# Patient Record
Sex: Female | Born: 1960 | Race: White | Hispanic: Yes | Marital: Married | Smoking: Never smoker
Health system: Southern US, Community
[De-identification: ages and names within clinical notes are randomized; demographics above are authoritative.]

## PROBLEM LIST (undated history)

## (undated) DIAGNOSIS — E669 Obesity, unspecified: Secondary | ICD-10-CM

## (undated) DIAGNOSIS — M1990 Unspecified osteoarthritis, unspecified site: Secondary | ICD-10-CM

## (undated) DIAGNOSIS — Z780 Asymptomatic menopausal state: Secondary | ICD-10-CM

## (undated) DIAGNOSIS — T7840XA Allergy, unspecified, initial encounter: Secondary | ICD-10-CM

## (undated) DIAGNOSIS — E785 Hyperlipidemia, unspecified: Secondary | ICD-10-CM

## (undated) HISTORY — PX: ABDOMINAL HYSTERECTOMY: SHX81

## (undated) HISTORY — PX: OTHER SURGICAL HISTORY: SHX169

## (undated) HISTORY — DX: Obesity, unspecified: E66.9

## (undated) HISTORY — DX: Asymptomatic menopausal state: Z78.0

## (undated) HISTORY — DX: Unspecified osteoarthritis, unspecified site: M19.90

## (undated) HISTORY — DX: Hyperlipidemia, unspecified: E78.5

## (undated) HISTORY — PX: KNEE ARTHROSCOPY: SUR90

## (undated) HISTORY — DX: Allergy, unspecified, initial encounter: T78.40XA

---

## 1997-12-06 ENCOUNTER — Ambulatory Visit (HOSPITAL_BASED_OUTPATIENT_CLINIC_OR_DEPARTMENT_OTHER): Admission: RE | Admit: 1997-12-06 | Discharge: 1997-12-06 | Payer: Self-pay | Admitting: Urology

## 1998-01-10 ENCOUNTER — Other Ambulatory Visit: Admission: RE | Admit: 1998-01-10 | Discharge: 1998-01-10 | Payer: Self-pay | Admitting: *Deleted

## 1998-04-16 ENCOUNTER — Ambulatory Visit (HOSPITAL_BASED_OUTPATIENT_CLINIC_OR_DEPARTMENT_OTHER): Admission: RE | Admit: 1998-04-16 | Discharge: 1998-04-16 | Payer: Self-pay | Admitting: Urology

## 1998-08-31 ENCOUNTER — Other Ambulatory Visit: Admission: RE | Admit: 1998-08-31 | Discharge: 1998-08-31 | Payer: Self-pay | Admitting: *Deleted

## 1999-02-28 ENCOUNTER — Other Ambulatory Visit: Admission: RE | Admit: 1999-02-28 | Discharge: 1999-02-28 | Payer: Self-pay | Admitting: *Deleted

## 1999-10-03 ENCOUNTER — Other Ambulatory Visit: Admission: RE | Admit: 1999-10-03 | Discharge: 1999-10-03 | Payer: Self-pay | Admitting: *Deleted

## 2000-04-21 ENCOUNTER — Encounter: Admission: RE | Admit: 2000-04-21 | Discharge: 2000-05-27 | Payer: Self-pay | Admitting: Internal Medicine

## 2000-04-30 ENCOUNTER — Other Ambulatory Visit: Admission: RE | Admit: 2000-04-30 | Discharge: 2000-04-30 | Payer: Self-pay | Admitting: *Deleted

## 2001-10-01 ENCOUNTER — Other Ambulatory Visit: Admission: RE | Admit: 2001-10-01 | Discharge: 2001-10-01 | Payer: Self-pay | Admitting: *Deleted

## 2006-01-17 HISTORY — PX: INCONTINENCE SURGERY: SHX676

## 2009-03-20 HISTORY — PX: OTHER SURGICAL HISTORY: SHX169

## 2010-07-03 ENCOUNTER — Ambulatory Visit (INDEPENDENT_AMBULATORY_CARE_PROVIDER_SITE_OTHER): Payer: BC Managed Care – PPO | Admitting: Internal Medicine

## 2010-07-03 DIAGNOSIS — M159 Polyosteoarthritis, unspecified: Secondary | ICD-10-CM

## 2010-07-03 DIAGNOSIS — E785 Hyperlipidemia, unspecified: Secondary | ICD-10-CM

## 2010-07-03 DIAGNOSIS — N951 Menopausal and female climacteric states: Secondary | ICD-10-CM

## 2010-07-11 ENCOUNTER — Ambulatory Visit: Payer: BC Managed Care – PPO | Admitting: Internal Medicine

## 2010-07-29 ENCOUNTER — Ambulatory Visit (INDEPENDENT_AMBULATORY_CARE_PROVIDER_SITE_OTHER): Payer: BC Managed Care – PPO | Admitting: Internal Medicine

## 2010-07-29 ENCOUNTER — Other Ambulatory Visit: Payer: Self-pay

## 2010-07-29 DIAGNOSIS — E669 Obesity, unspecified: Secondary | ICD-10-CM

## 2010-07-29 DIAGNOSIS — E785 Hyperlipidemia, unspecified: Secondary | ICD-10-CM

## 2010-07-29 DIAGNOSIS — Z1272 Encounter for screening for malignant neoplasm of vagina: Secondary | ICD-10-CM

## 2010-07-29 DIAGNOSIS — R7989 Other specified abnormal findings of blood chemistry: Secondary | ICD-10-CM

## 2010-07-29 DIAGNOSIS — Z113 Encounter for screening for infections with a predominantly sexual mode of transmission: Secondary | ICD-10-CM

## 2010-07-29 DIAGNOSIS — Z01419 Encounter for gynecological examination (general) (routine) without abnormal findings: Secondary | ICD-10-CM

## 2010-08-01 ENCOUNTER — Other Ambulatory Visit: Payer: Self-pay | Admitting: Internal Medicine

## 2010-08-05 ENCOUNTER — Ambulatory Visit: Payer: BC Managed Care – PPO | Attending: Internal Medicine | Admitting: Physical Therapy

## 2010-08-05 DIAGNOSIS — M62838 Other muscle spasm: Secondary | ICD-10-CM | POA: Insufficient documentation

## 2010-08-05 DIAGNOSIS — IMO0001 Reserved for inherently not codable concepts without codable children: Secondary | ICD-10-CM | POA: Insufficient documentation

## 2010-08-05 DIAGNOSIS — M2420 Disorder of ligament, unspecified site: Secondary | ICD-10-CM | POA: Insufficient documentation

## 2010-08-05 DIAGNOSIS — M629 Disorder of muscle, unspecified: Secondary | ICD-10-CM | POA: Insufficient documentation

## 2010-08-06 ENCOUNTER — Ambulatory Visit (HOSPITAL_BASED_OUTPATIENT_CLINIC_OR_DEPARTMENT_OTHER)
Admission: RE | Admit: 2010-08-06 | Discharge: 2010-08-06 | Disposition: A | Discharge status: Home / Self Care | Payer: BC Managed Care – PPO | Source: Ambulatory Visit | Attending: Internal Medicine | Admitting: Internal Medicine

## 2010-08-06 DIAGNOSIS — R7989 Other specified abnormal findings of blood chemistry: Secondary | ICD-10-CM

## 2010-08-06 DIAGNOSIS — R945 Abnormal results of liver function studies: Secondary | ICD-10-CM | POA: Insufficient documentation

## 2010-08-07 ENCOUNTER — Encounter: Payer: Self-pay | Admitting: Internal Medicine

## 2010-08-07 ENCOUNTER — Ambulatory Visit (AMBULATORY_SURGERY_CENTER): Payer: BC Managed Care – PPO | Admitting: *Deleted

## 2010-08-07 VITALS — Ht 62.0 in | Wt 194.2 lb

## 2010-08-07 DIAGNOSIS — Z1211 Encounter for screening for malignant neoplasm of colon: Secondary | ICD-10-CM

## 2010-08-07 MED ORDER — PEG-KCL-NACL-NASULF-NA ASC-C 100 G PO SOLR: ORAL | Status: DC

## 2010-08-12 ENCOUNTER — Ambulatory Visit: Payer: BC Managed Care – PPO | Admitting: Physical Therapy

## 2010-08-12 ENCOUNTER — Telehealth: Payer: Self-pay | Admitting: Gastroenterology

## 2010-08-12 MED ORDER — PEG-KCL-NACL-NASULF-NA ASC-C 100 G PO SOLR: ORAL | Status: DC

## 2010-08-12 NOTE — Telephone Encounter (Signed)
SPOKE WITH PATIENT, NOTIFIED HER THAT MOVIPREP WAS RESENT TO HER PHARMACY. ALSO I CALLED [facility] LEFT MESSAGE FOR PATIENTS MOVIPREP RX. Sherren Kerns

## 2010-08-19 ENCOUNTER — Ambulatory Visit: Payer: BC Managed Care – PPO | Attending: Internal Medicine | Admitting: Physical Therapy

## 2010-08-19 ENCOUNTER — Ambulatory Visit: Payer: BC Managed Care – PPO | Admitting: *Deleted

## 2010-08-19 DIAGNOSIS — IMO0001 Reserved for inherently not codable concepts without codable children: Secondary | ICD-10-CM | POA: Insufficient documentation

## 2010-08-19 DIAGNOSIS — M2420 Disorder of ligament, unspecified site: Secondary | ICD-10-CM | POA: Insufficient documentation

## 2010-08-19 DIAGNOSIS — M629 Disorder of muscle, unspecified: Secondary | ICD-10-CM | POA: Insufficient documentation

## 2010-08-19 DIAGNOSIS — M62838 Other muscle spasm: Secondary | ICD-10-CM | POA: Insufficient documentation

## 2010-08-28 ENCOUNTER — Other Ambulatory Visit: Payer: BC Managed Care – PPO | Admitting: Gastroenterology

## 2010-08-30 ENCOUNTER — Ambulatory Visit: Payer: BC Managed Care – PPO | Admitting: Physical Therapy

## 2010-09-04 ENCOUNTER — Encounter: Payer: Self-pay | Admitting: *Deleted

## 2010-09-04 ENCOUNTER — Encounter: Payer: BC Managed Care – PPO | Attending: Internal Medicine | Admitting: *Deleted

## 2010-09-04 ENCOUNTER — Ambulatory Visit: Payer: BC Managed Care – PPO | Admitting: *Deleted

## 2010-09-04 DIAGNOSIS — E669 Obesity, unspecified: Secondary | ICD-10-CM | POA: Insufficient documentation

## 2010-09-04 DIAGNOSIS — Z713 Dietary counseling and surveillance: Secondary | ICD-10-CM | POA: Insufficient documentation

## 2010-09-04 NOTE — Patient Instructions (Addendum)
Goals:  Eat 3 meals/day -- Avoid meal skipping.   Increase protein rich foods.  Follow "Plate Method" for portion control.  Limit carbohydrate 45 grams per meal and 15 grams for snack.   Choose more whole grains, lean protein, low-fat dairy, and fruits/non-starchy vegetables.   Aim for >30 min of physical activity daily as able.  Limit sugar-sweetened beverages and concentrated sweets.

## 2010-09-04 NOTE — Progress Notes (Signed)
  Medical Nutrition Therapy:  Appt start time:  4:30p  end time: 5:30p.   Assessment:  Primary concerns today: Obesity/Weight Management.  Pt 9 mos s/p knee surgery here for weight management.  Unable to exercise for long periods d/t pain/swelling in knee.  Overall dietary intake WNL, but excessive CHO portions noted.  Pt states she gained 20 lbs in the last ~4-5 years and wants to lose 40-50 lbs.  MEDICATIONS: Glucosamine-Chondroitin, Remfemin (Black Cohosh), advil/motrin (prn)  DIETARY INTAKE:  Usual eating pattern includes 3 meals and 1-2 snacks per day.  24-hr recall:  B ( AM): Honey nut cheerios, blueberries, skim milk  Snk ( AM): none  L ( PM): Sandwich, animal crackers, chobani yogurt (flavored), crystal light Snk ( PM): cheese stick, special K bar D ( PM): Spaghetti, french bread (2 pcs), salad w/ LF balsalmic dressing Snk ( PM): ice cream, special K bar (in summer only)  Usual physical activity: Stationary bike 2-3x/week for 7-8 min -- reports knee surgery 11/2009 and current sx of fluid/pain in knee prohibiting most exercise.  Estimated energy needs: 1300 calories 160-165 g carbohydrates 80 g protein 35-38 g fat 25-30 g fiber  Progress Towards Goal(s):  NEW.   Nutritional Diagnosis:  [place]-3.3 Obesity related to excessive CHO intake and lack of exercise post-surgery as evidenced by food recall and patient report of knee pain upon physical exertion.    Intervention/Goals:  Eat 3 meals/day -- Avoid meal skipping.   Increase protein rich foods.  Follow "Plate Method" for portion control.  Limit carbohydrate 45 grams per meal and 15 grams for snack.   Choose more whole grains, lean protein, low-fat dairy, and fruits/non-starchy vegetables.   Aim for >30 min of physical activity daily as able.  Limit sugar-sweetened beverages and concentrated sweets.  Monitoring/Evaluation:  Dietary intake, exercise, and body weight in 6 week(s).

## 2010-09-05 ENCOUNTER — Encounter: Payer: Self-pay | Admitting: *Deleted

## 2010-09-10 ENCOUNTER — Encounter: Payer: Self-pay | Admitting: Gastroenterology

## 2010-09-10 ENCOUNTER — Ambulatory Visit (AMBULATORY_SURGERY_CENTER): Payer: BC Managed Care – PPO | Admitting: Gastroenterology

## 2010-09-10 VITALS — BP 112/64 | HR 70 | Temp 98.4°F | Resp 16 | Ht 63.0 in | Wt 195.0 lb

## 2010-09-10 DIAGNOSIS — Z1211 Encounter for screening for malignant neoplasm of colon: Secondary | ICD-10-CM

## 2010-09-10 MED ORDER — SODIUM CHLORIDE 0.9 % IV SOLN: 500.0000 mL | INTRAVENOUS | Status: DC

## 2010-09-10 NOTE — Patient Instructions (Signed)
Please review discharge instructions.

## 2010-09-11 ENCOUNTER — Telehealth: Payer: Self-pay | Admitting: *Deleted

## 2010-09-11 ENCOUNTER — Telehealth: Payer: Self-pay | Admitting: Internal Medicine

## 2010-09-11 DIAGNOSIS — L4050 Arthropathic psoriasis, unspecified: Secondary | ICD-10-CM

## 2010-09-11 NOTE — Telephone Encounter (Signed)
Pt saw Dr Emily Filbert for Psoriasis and they want her to be referred to a Rheumatologists Dr Dierdre Forth.  The referral is for Psoriasis Arthritis. Dr Shawnee Knapp phone number [phone].  Pt phone number to call back [phone].

## 2010-09-11 NOTE — Telephone Encounter (Signed)
Ok to schedule referral to Dr Dierdre Forth?  See consult note on pt in your bin on your desk for details from visit

## 2010-09-11 NOTE — Telephone Encounter (Signed)

## 2010-09-12 NOTE — Telephone Encounter (Signed)
Spoke with Pam at [facility], they did the referral to Dr Shawnee Knapp [facility] on 08/29/10, but have not heard anything in response.  Spoke with pt.  She states the problem is [employer] (her secondary insurance) requires a referral from her primary care to a specialist in order for [employer] to pay.  Okay per DDS to do referral to Dr Dierdre Forth in rheumatology.

## 2010-09-12 NOTE — Telephone Encounter (Signed)
Selena Batten- can you please complete referral to Dr Dierdre Forth at [facility] for psoriatic arthritis?  Thanks!

## 2010-09-12 NOTE — Telephone Encounter (Signed)
OK to call Dr. Dellia Nims office to see if they set up this appt. already

## 2010-09-12 NOTE — Telephone Encounter (Signed)
Appt scheduled with Dr Dierdre Forth on August 1,2012 at 10:00 am. Pt notified of appt by phone.

## 2010-09-18 ENCOUNTER — Ambulatory Visit: Payer: BC Managed Care – PPO | Attending: Internal Medicine | Admitting: Physical Therapy

## 2010-09-18 DIAGNOSIS — IMO0001 Reserved for inherently not codable concepts without codable children: Secondary | ICD-10-CM | POA: Insufficient documentation

## 2010-09-18 DIAGNOSIS — M62838 Other muscle spasm: Secondary | ICD-10-CM | POA: Insufficient documentation

## 2010-09-18 DIAGNOSIS — M629 Disorder of muscle, unspecified: Secondary | ICD-10-CM | POA: Insufficient documentation

## 2010-09-18 DIAGNOSIS — M2420 Disorder of ligament, unspecified site: Secondary | ICD-10-CM | POA: Insufficient documentation

## 2010-10-02 NOTE — Progress Notes (Signed)
Addended by: Virgel Paling on: 10/02/2010 03:44 PM   Modules accepted: Orders

## 2010-10-16 ENCOUNTER — Ambulatory Visit: Payer: BC Managed Care – PPO | Admitting: Physical Therapy

## 2010-10-16 ENCOUNTER — Ambulatory Visit: Payer: BC Managed Care – PPO | Admitting: *Deleted

## 2010-10-28 ENCOUNTER — Encounter: Payer: BC Managed Care – PPO | Admitting: Physical Therapy

## 2010-10-30 ENCOUNTER — Ambulatory Visit: Payer: BC Managed Care – PPO | Attending: Internal Medicine | Admitting: Physical Therapy

## 2010-10-30 DIAGNOSIS — IMO0001 Reserved for inherently not codable concepts without codable children: Secondary | ICD-10-CM | POA: Insufficient documentation

## 2010-10-30 DIAGNOSIS — M2420 Disorder of ligament, unspecified site: Secondary | ICD-10-CM | POA: Insufficient documentation

## 2010-10-30 DIAGNOSIS — M62838 Other muscle spasm: Secondary | ICD-10-CM | POA: Insufficient documentation

## 2010-10-30 DIAGNOSIS — M629 Disorder of muscle, unspecified: Secondary | ICD-10-CM | POA: Insufficient documentation

## 2010-11-04 ENCOUNTER — Ambulatory Visit: Payer: BC Managed Care – PPO | Admitting: Physical Therapy

## 2010-11-06 ENCOUNTER — Ambulatory Visit: Payer: BC Managed Care – PPO | Admitting: Physical Therapy

## 2010-11-11 ENCOUNTER — Ambulatory Visit: Payer: BC Managed Care – PPO | Admitting: Physical Therapy

## 2010-11-13 ENCOUNTER — Ambulatory Visit: Payer: BC Managed Care – PPO | Admitting: Physical Therapy

## 2010-11-18 ENCOUNTER — Ambulatory Visit: Payer: BC Managed Care – PPO | Attending: Internal Medicine | Admitting: Physical Therapy

## 2010-11-18 DIAGNOSIS — M2420 Disorder of ligament, unspecified site: Secondary | ICD-10-CM | POA: Insufficient documentation

## 2010-11-18 DIAGNOSIS — IMO0001 Reserved for inherently not codable concepts without codable children: Secondary | ICD-10-CM | POA: Insufficient documentation

## 2010-11-18 DIAGNOSIS — M62838 Other muscle spasm: Secondary | ICD-10-CM | POA: Insufficient documentation

## 2010-11-18 DIAGNOSIS — M629 Disorder of muscle, unspecified: Secondary | ICD-10-CM | POA: Insufficient documentation

## 2010-11-20 ENCOUNTER — Ambulatory Visit: Payer: BC Managed Care – PPO | Admitting: Physical Therapy

## 2010-11-25 ENCOUNTER — Ambulatory Visit: Payer: BC Managed Care – PPO | Admitting: Physical Therapy

## 2010-11-27 ENCOUNTER — Ambulatory Visit: Payer: BC Managed Care – PPO | Admitting: Physical Therapy

## 2010-12-02 ENCOUNTER — Encounter: Payer: BC Managed Care – PPO | Admitting: Physical Therapy

## 2010-12-04 ENCOUNTER — Encounter: Payer: BC Managed Care – PPO | Admitting: Physical Therapy

## 2010-12-09 ENCOUNTER — Encounter: Payer: BC Managed Care – PPO | Admitting: Physical Therapy

## 2010-12-11 ENCOUNTER — Encounter: Payer: BC Managed Care – PPO | Admitting: Physical Therapy

## 2010-12-16 ENCOUNTER — Encounter: Payer: BC Managed Care – PPO | Admitting: Physical Therapy

## 2010-12-18 ENCOUNTER — Encounter: Payer: BC Managed Care – PPO | Admitting: Physical Therapy

## 2010-12-23 ENCOUNTER — Encounter: Payer: BC Managed Care – PPO | Admitting: Physical Therapy

## 2010-12-25 ENCOUNTER — Encounter: Payer: BC Managed Care – PPO | Admitting: Physical Therapy

## 2012-02-18 HISTORY — PX: KNEE CARTILAGE SURGERY: SHX688

## 2012-04-14 ENCOUNTER — Ambulatory Visit (INDEPENDENT_AMBULATORY_CARE_PROVIDER_SITE_OTHER): Payer: BC Managed Care – PPO | Admitting: Internal Medicine

## 2012-04-14 ENCOUNTER — Ambulatory Visit (HOSPITAL_BASED_OUTPATIENT_CLINIC_OR_DEPARTMENT_OTHER)
Admission: RE | Admit: 2012-04-14 | Discharge: 2012-04-14 | Disposition: A | Discharge status: Home / Self Care | Payer: BC Managed Care – PPO | Source: Ambulatory Visit | Attending: Internal Medicine | Admitting: Internal Medicine

## 2012-04-14 ENCOUNTER — Encounter: Payer: Self-pay | Admitting: Internal Medicine

## 2012-04-14 ENCOUNTER — Ambulatory Visit (HOSPITAL_BASED_OUTPATIENT_CLINIC_OR_DEPARTMENT_OTHER): Admission: RE | Admit: 2012-04-14 | Payer: BC Managed Care – PPO | Source: Ambulatory Visit

## 2012-04-14 VITALS — BP 136/86 | HR 80 | Temp 98.6°F | Resp 16 | Ht 63.0 in | Wt 198.0 lb

## 2012-04-14 DIAGNOSIS — L409 Psoriasis, unspecified: Secondary | ICD-10-CM | POA: Insufficient documentation

## 2012-04-14 DIAGNOSIS — L408 Other psoriasis: Secondary | ICD-10-CM

## 2012-04-14 DIAGNOSIS — R922 Inconclusive mammogram: Secondary | ICD-10-CM | POA: Insufficient documentation

## 2012-04-14 DIAGNOSIS — Z90711 Acquired absence of uterus with remaining cervical stump: Secondary | ICD-10-CM

## 2012-04-14 DIAGNOSIS — Z0000 Encounter for general adult medical examination without abnormal findings: Secondary | ICD-10-CM

## 2012-04-14 DIAGNOSIS — L821 Other seborrheic keratosis: Secondary | ICD-10-CM

## 2012-04-14 DIAGNOSIS — E669 Obesity, unspecified: Secondary | ICD-10-CM

## 2012-04-14 LAB — POCT URINALYSIS DIPSTICK
Bilirubin, UA: NEGATIVE
Ketones, UA: NEGATIVE
Leukocytes, UA: NEGATIVE
Spec Grav, UA: 1.015
pH, UA: 5

## 2012-04-14 NOTE — Patient Instructions (Addendum)
Activate my chart    To have mammogram today

## 2012-04-14 NOTE — Progress Notes (Signed)
Subjective:    Patient ID: Brittany Roberson, female    DOB: June 01, 1960, 52 y.o.   MRN: [id number]  HPI Mariama is here for CPE  Had recent R knee surgery per Dr. Charlann Boxer  Other joints OK  Psoriasis  Has Clobetasol when needed  Mild hyperlipidemia  Watching diet  She is frustrated by weight gain and would like to see someone about this    Allergies  Allergen Reactions  . Shellfish Allergy Itching    flushing   Past Medical History  Diagnosis Date  . Allergy   . Arthritis     knees  . Menopause   . Hyperlipidemia   . Endometriosis   . Obesity    Past Surgical History  Procedure Laterality Date  . Abdominal hysterectomy      2007  . Carpal tunnel repair  2/11    Right  . Menicus repair      rt.knee-11/2009  . Incontinence surgery  12/07  . Knee arthroscopy     History   Social History  . Marital Status: Married    Spouse Name: Rocky Link    Number of Children: 0  . Years of Education: Master's   Occupational History  . SPEECH PATHOLOGIST    Social History Main Topics  . Smoking status: Never Smoker   . Smokeless tobacco: Never Used  . Alcohol Use: No  . Drug Use: No  . Sexually Active: Yes -- Female partner(s)   Other Topics Concern  . Not on file   Social History Narrative  . No narrative on file   Family History  Problem Relation Age of Onset  . Hypertension Father   . Hyperlipidemia Father    Patient Active Problem List  Diagnosis  . DJD (degenerative joint disease) of knee  . Other and unspecified hyperlipidemia  . S/P hysterectomy  . History of hysterectomy, supracervical  . Psoriasis   Current Outpatient Prescriptions on File Prior to Visit  Medication Sig Dispense Refill  . Black Cohosh (REMIFEMIN PO) Take by mouth 2 (two) times daily.        Marland Kitchen glucosamine-chondroitin 500-400 MG tablet Take 1 tablet by mouth 3 (three) times daily.       Marland Kitchen ibuprofen (ADVIL,MOTRIN) 200 MG tablet Take 400 mg by mouth every 6 (six) hours as needed.          No current facility-administered medications on file prior to visit.       Review of Systems     Objective:   Physical Exam Physical Exam  Nursing note and vitals reviewed.  Constitutional: She is oriented to person, place, and time. She appears well-developed and well-nourished.  HENT:  Head: Normocephalic and atraumatic.  Right Ear: Tympanic membrane and ear canal normal. No drainage. Tympanic membrane is not injected and not erythematous.  Left Ear: Tympanic membrane and ear canal normal. No drainage. Tympanic membrane is not injected and not erythematous.  Nose: Nose normal. Right sinus exhibits no maxillary sinus tenderness and no frontal sinus tenderness. Left sinus exhibits no maxillary sinus tenderness and no frontal sinus tenderness.  Mouth/Throat: Oropharynx is clear and moist. No oral lesions. No oropharyngeal exudate.  Eyes: Conjunctivae and EOM are normal. Pupils are equal, round, and reactive to light.  Neck: Normal range of motion. Neck supple. No JVD present. Carotid bruit is not present. No mass and no thyromegaly present.  Cardiovascular: Normal rate, regular rhythm, S1 normal, S2 normal and intact distal pulses. Exam reveals no gallop and no  friction rub.  No murmur heard.  Pulses:  Carotid pulses are 2+ on the right side, and 2+ on the left side.  Dorsalis pedis pulses are 2+ on the right side, and 2+ on the left side.  No carotid bruit. No LE edema  Pulmonary/Chest: Breath sounds normal. She has no wheezes. She has no rales. She exhibits no tenderness. Breasts no discrete masses no nipple discharge no axillary adenopathy bilaterally Abdominal: Soft. Bowel sounds are normal. She exhibits no distension and no mass. There is no hepatosplenomegaly. There is no tenderness. There is no CVA tenderness.  Rectal no mass guaiac neg Musculoskeletal: Normal range of motion.  No active synovitis to joints.  Lymphadenopathy:  She has no cervical adenopathy.  She has no  axillary adenopathy.  Right: No inguinal and no supraclavicular adenopathy present.  Left: No inguinal and no supraclavicular adenopathy present.  Neurological: She is alert and oriented to person, place, and time. She has normal strength and normal reflexes. She displays no tremor. No cranial nerve deficit or sensory deficit. Coordination and gait normal.  Skin: Skin is warm and dry. No rash noted. No cyanosis. Nails show no clubbing.  Psychiatric: She has a normal mood and affect. Her speech is normal and behavior is normal. Cognition and memory are normal.           Assessment & Plan:  Health Maintenance MM today  Pap for supracervical hys  Done 2012   Labs today  See scanned sheet  Hyperlipidemia  Check today  Obesity will refer to Dr. Kinnie Scales  History of mild elevation lfts    Ultrasound neg for mass lelsion  Djd  Psoriasis

## 2012-04-16 ENCOUNTER — Other Ambulatory Visit: Payer: Self-pay | Admitting: Internal Medicine

## 2012-04-16 LAB — LIPID PANEL
Cholesterol: 192 mg/dL (ref 0–200)
HDL: 40 mg/dL (ref >39)
Total CHOL/HDL Ratio: 4.8 Ratio
VLDL: 29 mg/dL (ref 0–40)

## 2012-04-16 LAB — CBC WITH DIFFERENTIAL/PLATELET
Basophils Relative: 0 % (ref 0–1)
Eosinophils Absolute: 0.1 10*3/uL (ref 0.0–0.7)
Eosinophils Relative: 1 % (ref 0–5)
HCT: 42.9 % (ref 36.0–46.0)
Hemoglobin: 15.9 g/dL — ABNORMAL HIGH (ref 12.0–15.0)
MCH: 32.4 pg (ref 26.0–34.0)
MCHC: 37.1 g/dL — ABNORMAL HIGH (ref 30.0–36.0)
Monocytes Absolute: 0.5 10*3/uL (ref 0.1–1.0)
Monocytes Relative: 9 % (ref 3–12)
RDW: 14 % (ref 11.5–15.5)

## 2012-04-19 ENCOUNTER — Encounter: Payer: Self-pay | Admitting: *Deleted

## 2012-04-21 ENCOUNTER — Encounter: Payer: Self-pay | Admitting: *Deleted

## 2012-04-28 ENCOUNTER — Encounter: Payer: Self-pay | Admitting: *Deleted

## 2012-04-30 ENCOUNTER — Ambulatory Visit
Admission: RE | Admit: 2012-04-30 | Discharge: 2012-04-30 | Disposition: A | Discharge status: Home / Self Care | Payer: BC Managed Care – PPO | Source: Ambulatory Visit | Attending: Internal Medicine | Admitting: Internal Medicine

## 2012-05-10 ENCOUNTER — Encounter: Payer: Self-pay | Admitting: *Deleted

## 2012-12-23 ENCOUNTER — Other Ambulatory Visit: Payer: Self-pay

## 2013-05-16 ENCOUNTER — Ambulatory Visit: Payer: Self-pay | Admitting: Physician Assistant

## 2013-05-30 ENCOUNTER — Ambulatory Visit (HOSPITAL_BASED_OUTPATIENT_CLINIC_OR_DEPARTMENT_OTHER)
Admission: RE | Admit: 2013-05-30 | Discharge: 2013-05-30 | Disposition: A | Discharge status: Home / Self Care | Payer: BC Managed Care – PPO | Source: Ambulatory Visit | Attending: Internal Medicine | Admitting: Internal Medicine

## 2013-05-30 ENCOUNTER — Encounter: Payer: Self-pay | Admitting: Internal Medicine

## 2013-05-30 ENCOUNTER — Ambulatory Visit (INDEPENDENT_AMBULATORY_CARE_PROVIDER_SITE_OTHER): Payer: BC Managed Care – PPO | Admitting: Internal Medicine

## 2013-05-30 VITALS — BP 138/81 | HR 73 | Temp 97.9°F | Resp 18 | Wt 198.0 lb

## 2013-05-30 DIAGNOSIS — M545 Low back pain, unspecified: Secondary | ICD-10-CM | POA: Insufficient documentation

## 2013-05-30 DIAGNOSIS — R0602 Shortness of breath: Secondary | ICD-10-CM | POA: Insufficient documentation

## 2013-05-30 DIAGNOSIS — R3989 Other symptoms and signs involving the genitourinary system: Secondary | ICD-10-CM

## 2013-05-30 DIAGNOSIS — M25559 Pain in unspecified hip: Secondary | ICD-10-CM | POA: Insufficient documentation

## 2013-05-30 DIAGNOSIS — R399 Unspecified symptoms and signs involving the genitourinary system: Secondary | ICD-10-CM

## 2013-05-30 DIAGNOSIS — R0600 Dyspnea, unspecified: Secondary | ICD-10-CM

## 2013-05-30 DIAGNOSIS — N809 Endometriosis, unspecified: Secondary | ICD-10-CM

## 2013-05-30 DIAGNOSIS — M549 Dorsalgia, unspecified: Secondary | ICD-10-CM

## 2013-05-30 DIAGNOSIS — R0609 Other forms of dyspnea: Secondary | ICD-10-CM

## 2013-05-30 DIAGNOSIS — Z90710 Acquired absence of both cervix and uterus: Secondary | ICD-10-CM

## 2013-05-30 DIAGNOSIS — R0989 Other specified symptoms and signs involving the circulatory and respiratory systems: Secondary | ICD-10-CM

## 2013-05-30 LAB — CBC WITH DIFFERENTIAL/PLATELET
BASOS ABS: 0 10*3/uL (ref 0.0–0.1)
BASOS PCT: 0 % (ref 0–1)
EOS ABS: 0.1 10*3/uL (ref 0.0–0.7)
EOS PCT: 1 % (ref 0–5)
HEMATOCRIT: 42.8 % (ref 36.0–46.0)
Hemoglobin: 15.6 g/dL — ABNORMAL HIGH (ref 12.0–15.0)
LYMPHS PCT: 32 % (ref 12–46)
Lymphs Abs: 2.1 10*3/uL (ref 0.7–4.0)
MCH: 32.2 pg (ref 26.0–34.0)
MCHC: 36.4 g/dL — AB (ref 30.0–36.0)
MCV: 88.2 fL (ref 78.0–100.0)
MONO ABS: 0.5 10*3/uL (ref 0.1–1.0)
Monocytes Relative: 7 % (ref 3–12)
Neutro Abs: 4 10*3/uL (ref 1.7–7.7)
Neutrophils Relative %: 60 % (ref 43–77)
PLATELETS: 256 10*3/uL (ref 150–400)
RBC: 4.85 MIL/uL (ref 3.87–5.11)
RDW: 14 % (ref 11.5–15.5)
WBC: 6.6 10*3/uL (ref 4.0–10.5)

## 2013-05-30 LAB — COMPREHENSIVE METABOLIC PANEL
ALK PHOS: 93 U/L (ref 39–117)
ALT: 46 U/L — AB (ref 0–35)
AST: 27 U/L (ref 0–37)
Albumin: 4.1 g/dL (ref 3.5–5.2)
BILIRUBIN TOTAL: 0.6 mg/dL (ref 0.2–1.2)
BUN: 9 mg/dL (ref 6–23)
CALCIUM: 9.5 mg/dL (ref 8.4–10.5)
CHLORIDE: 104 meq/L (ref 96–112)
CO2: 28 mEq/L (ref 19–32)
CREATININE: 0.6 mg/dL (ref 0.50–1.10)
Glucose, Bld: 93 mg/dL (ref 70–99)
Potassium: 4.2 mEq/L (ref 3.5–5.3)
Sodium: 140 mEq/L (ref 135–145)
Total Protein: 6.5 g/dL (ref 6.0–8.3)

## 2013-05-30 LAB — AMYLASE: Amylase: 30 U/L (ref 0–105)

## 2013-05-30 MED ORDER — NABUMETONE 500 MG PO TABS
500.0000 mg | ORAL_TABLET | Freq: Two times a day (BID) | ORAL | Status: DC
Start: 2013-05-30 — End: 2013-06-08

## 2013-05-30 NOTE — Progress Notes (Addendum)
Subjective:    Patient ID: Brittany Roberson, female    DOB: 1960-06-25, 53 y.o.   MRN: [id number]  HPI  Brittany Roberson is here for acute visit.  She has multiple concerns.    Upon entering room pt states  " I know I am tired"    Two weeks ago was seen and treated for UTI and presumed kidney stone.   Treated with a course of Cipro  For about 2 weeks.   [facility] where she had a CT done and pt reports everything OK except "fatty liver"  I do not have records from ER visit.    Doing well for several days but now pain has returned and she locates her pain in R side of lower back near SI joint.  No dysuria or urgency  She also describes some SOB over weekend last 48 hours.  Pain in R shoudler past 2-3 weeks.  Occasionally her hands swell and "rings feel tight"   No chest pain or palpitations,  No left sided arm or jaw pain,  No N/V diaphoresis during episodes of SOB.  Symptoms not exertional  Allergies  Allergen Reactions  . Shellfish Allergy Itching    flushing   Past Medical History  Diagnosis Date  . Allergy   . Arthritis     knees  . Menopause   . Hyperlipidemia   . Endometriosis   . Obesity    Past Surgical History  Procedure Laterality Date  . Abdominal hysterectomy      2007  . Carpal tunnel repair  2/11    Right  . Menicus repair      rt.knee-11/2009  . Incontinence surgery  12/07  . Knee arthroscopy     History   Social History  . Marital Status: Married    Spouse Name: Rocky LinkKen    Number of Children: 0  . Years of Education: Master's   Occupational History  . SPEECH PATHOLOGIST    Social History Main Topics  . Smoking status: Never Smoker   . Smokeless tobacco: Never Used  . Alcohol Use: No  . Drug Use: No  . Sexual Activity: Yes    Partners: Male   Other Topics Concern  . Not on file   Social History Narrative  . No narrative on file   Family History  Problem Relation Age of Onset  . Hypertension Father   . Hyperlipidemia Father      Patient Active Problem List   Diagnosis Date Noted  . Endometriosis 05/30/2013  . DJD (degenerative joint disease) of knee 04/14/2012  . Other and unspecified hyperlipidemia 04/14/2012  . S/P hysterectomy 04/14/2012  . History of hysterectomy, supracervical 04/14/2012  . Psoriasis 04/14/2012  . SK (seborrheic keratosis) 04/14/2012   Current Outpatient Prescriptions on File Prior to Visit  Medication Sig Dispense Refill  . Black Cohosh (REMIFEMIN PO) Take by mouth 2 (two) times daily.        Marland Kitchen. glucosamine-chondroitin 500-400 MG tablet Take 1 tablet by mouth 3 (three) times daily.       Marland Kitchen. ibuprofen (ADVIL,MOTRIN) 200 MG tablet Take 400 mg by mouth every 6 (six) hours as needed.         No current facility-administered medications on file prior to visit.      Review of Systems See HPI    Objective:   Physical Exam Physical Exam  Nursing note and vitals reviewed.  Constitutional: She is oriented to person, place, and time.  She appears well-developed and well-nourished.  HENT:  Head: Normocephalic and atraumatic.  Cardiovascular: Normal rate and regular rhythm. Exam reveals no gallop and no friction rub.  No murmur heard.  Pulmonary/Chest: Breath sounds normal. She has no wheezes. She has no rales.  Neurological: She is alert and oriented to person, place, and time.  Skin: Skin is warm and dry.  Psychiatric: She has a normal mood and affect. Her behavior is normal.              Assessment & Plan:  Dyspnea    EKG no acute changes, will get CXR today  Recent hematuria UTI treatede with Cipro.  U/A completely normal in office today   Will send off for culture  R hip and lower back pain.   Will get plain films   She is to try Relafen bid until I see her next visit  Difficult to find a unifying diagnosis for her multiple concerns. I do not think her current back and hip pain are related to a UTI.  Will moniter for now.  She is to see me in office if pain more frequent  or worsening  Addendum:  [facility]  Stone protocl neg stone no hydronephrosis  No cause for pain identifiend  See scanned note

## 2013-05-30 NOTE — Patient Instructions (Signed)
See me in one weeks  To pharmacy, xray and lab today

## 2013-05-31 ENCOUNTER — Telehealth: Payer: Self-pay | Admitting: Internal Medicine

## 2013-05-31 ENCOUNTER — Encounter: Payer: Self-pay | Admitting: Internal Medicine

## 2013-05-31 NOTE — Telephone Encounter (Signed)
spke with pt and informed of all labs and xray results'   Continue relafen  If any worsening see me in office sooner

## 2013-06-08 ENCOUNTER — Ambulatory Visit (INDEPENDENT_AMBULATORY_CARE_PROVIDER_SITE_OTHER): Payer: BC Managed Care – PPO | Admitting: Internal Medicine

## 2013-06-08 ENCOUNTER — Encounter: Payer: Self-pay | Admitting: Internal Medicine

## 2013-06-08 VITALS — BP 125/76 | HR 78 | Temp 98.1°F | Resp 18

## 2013-06-08 DIAGNOSIS — M545 Low back pain, unspecified: Secondary | ICD-10-CM

## 2013-06-08 MED ORDER — NABUMETONE 500 MG PO TABS: 500.0000 mg | ORAL_TABLET | Freq: Two times a day (BID) | ORAL | Status: DC

## 2013-06-08 NOTE — Progress Notes (Signed)
Subjective:    Patient ID: Brittany Roberson, female    DOB: 07/09/1960, 53 y.o.   MRN: [id number]  HPI Brittany Roberson is here for follow up.  She had multiple concerns last visit but her pain seems to be localized to lower back and right sided SI area.  Her U/A last visit was normal    Relafen helps  She does note her R knee is more swollen and she is guarding that leg.    Xrays are negative except mild lumbar spurring and DJD    Allergies  Allergen Reactions  . Shellfish Allergy Itching    flushing   Past Medical History  Diagnosis Date  . Allergy   . Arthritis     knees  . Menopause   . Hyperlipidemia   . Endometriosis   . Obesity    Past Surgical History  Procedure Laterality Date  . Abdominal hysterectomy      2007  . Carpal tunnel repair  2/11    Right  . Menicus repair      rt.knee-11/2009  . Incontinence surgery  12/07  . Knee arthroscopy     History   Social History  . Marital Status: Married    Spouse Name: Rocky LinkKen    Number of Children: 0  . Years of Education: Master's   Occupational History  . SPEECH PATHOLOGIST    Social History Main Topics  . Smoking status: Never Smoker   . Smokeless tobacco: Never Used  . Alcohol Use: No  . Drug Use: No  . Sexual Activity: Yes    Partners: Male   Other Topics Concern  . Not on file   Social History Narrative  . No narrative on file   Family History  Problem Relation Age of Onset  . Hypertension Father   . Hyperlipidemia Father    Patient Active Problem List   Diagnosis Date Noted  . Endometriosis 05/30/2013  . DJD (degenerative joint disease) of knee 04/14/2012  . Other and unspecified hyperlipidemia 04/14/2012  . S/P hysterectomy 04/14/2012  . History of hysterectomy, supracervical 04/14/2012  . Psoriasis 04/14/2012  . SK (seborrheic keratosis) 04/14/2012   Current Outpatient Prescriptions on File Prior to Visit  Medication Sig Dispense Refill  . Black Cohosh (REMIFEMIN PO) Take by mouth 2 (two)  times daily.        Marland Kitchen. glucosamine-chondroitin 500-400 MG tablet Take 1 tablet by mouth 3 (three) times daily.       Marland Kitchen. ibuprofen (ADVIL,MOTRIN) 200 MG tablet Take 400 mg by mouth every 6 (six) hours as needed.        . nabumetone (RELAFEN) 500 MG tablet Take 1 tablet (500 mg total) by mouth 2 (two) times daily.  30 tablet  0   No current facility-administered medications on file prior to visit.       Review of Systems See HPI    Objective:   Physical Exam  Physical Exam  Nursing note and vitals reviewed.  Constitutional: She is oriented to person, place, and time. She appears well-developed and well-nourished.  HENT:  Head: Normocephalic and atraumatic.  Cardiovascular: Normal rate and regular rhythm. Exam reveals no gallop and no friction rub.  No murmur heard.  Pulmonary/Chest: Breath sounds normal. She has no wheezes. She has no rales.  Neurological: She is alert and oriented to person, place, and time.  Skin: Skin is warm and dry.  M/S  Point tender [facility] Psychiatric: She has a normal mood and affect.  Her behavior is normal.         Assessment & Plan:  R hip low back pain  Pt wishes to see her orthopedic  D.r Kronenwetter Sinklin  Ok to use RElafen and ES tylenol in between     See me as needed

## 2013-06-08 NOTE — Patient Instructions (Signed)
Pt to call Dr. Charlann Boxerlin  fo rappt     See me as needed

## 2013-06-09 ENCOUNTER — Encounter: Payer: Self-pay | Admitting: *Deleted

## 2014-01-01 ENCOUNTER — Emergency Department: Payer: Self-pay | Admitting: Emergency Medicine

## 2014-01-02 ENCOUNTER — Encounter: Payer: Self-pay | Admitting: Internal Medicine

## 2014-01-02 ENCOUNTER — Ambulatory Visit (INDEPENDENT_AMBULATORY_CARE_PROVIDER_SITE_OTHER): Payer: BC Managed Care – PPO | Admitting: Internal Medicine

## 2014-01-02 ENCOUNTER — Ambulatory Visit (HOSPITAL_BASED_OUTPATIENT_CLINIC_OR_DEPARTMENT_OTHER)
Admission: RE | Admit: 2014-01-02 | Discharge: 2014-01-02 | Disposition: A | Discharge status: Home / Self Care | Payer: BC Managed Care – PPO | Source: Ambulatory Visit | Attending: Internal Medicine | Admitting: Internal Medicine

## 2014-01-02 VITALS — BP 146/77 | HR 80 | Temp 98.8°F | Resp 16 | Ht 63.0 in | Wt 194.0 lb

## 2014-01-02 DIAGNOSIS — R220 Localized swelling, mass and lump, head: Secondary | ICD-10-CM | POA: Insufficient documentation

## 2014-01-02 DIAGNOSIS — R609 Edema, unspecified: Secondary | ICD-10-CM

## 2014-01-02 DIAGNOSIS — H9201 Otalgia, right ear: Secondary | ICD-10-CM | POA: Insufficient documentation

## 2014-01-02 LAB — COMPREHENSIVE METABOLIC PANEL
ALT: 34 U/L (ref 0–35)
AST: 28 U/L (ref 0–37)
Albumin: 4.1 g/dL (ref 3.5–5.2)
Alkaline Phosphatase: 106 U/L (ref 39–117)
BUN: 11 mg/dL (ref 6–23)
CALCIUM: 9.4 mg/dL (ref 8.4–10.5)
CHLORIDE: 106 meq/L (ref 96–112)
CO2: 25 meq/L (ref 19–32)
CREATININE: 0.8 mg/dL (ref 0.50–1.10)
GLUCOSE: 91 mg/dL (ref 70–99)
Potassium: 4.5 mEq/L (ref 3.5–5.3)
SODIUM: 140 meq/L (ref 135–145)
TOTAL PROTEIN: 6.9 g/dL (ref 6.0–8.3)
Total Bilirubin: 0.4 mg/dL (ref 0.2–1.2)

## 2014-01-02 LAB — AMYLASE: AMYLASE: 31 U/L (ref 0–105)

## 2014-01-02 MED ORDER — HYDROCORTISONE 2.5 % EX CREA: TOPICAL_CREAM | Freq: Two times a day (BID) | CUTANEOUS | Status: DC

## 2014-01-02 NOTE — Patient Instructions (Signed)
Will refer to ENT   To xray today

## 2014-01-02 NOTE — Progress Notes (Signed)
Subjective:    Patient ID: Brittany LarsenBarbara Roberson, female    DOB: 02-13-1961, 53 y.o.   MRN: [id number]  HPI Brittany Roberson is here for acute visit.   She reports she began with redness and itching around her Right eye on Friday.  Was seen in [facility] over weekend felt to be dental problem and was given Tid Amoxicillin which she has been taking.  NO new meds, or topicals.  She ate chinese food on Friday   Now with swelling in front of R ear and still with itching of eye .  NO fever   Allergies  Allergen Reactions  . Shellfish Allergy Itching    flushing   Past Medical History  Diagnosis Date  . Allergy   . Arthritis     knees  . Menopause   . Hyperlipidemia   . Endometriosis   . Obesity    Past Surgical History  Procedure Laterality Date  . Abdominal hysterectomy      2007  . Carpal tunnel repair  2/11    Right  . Menicus repair      rt.knee-11/2009  . Incontinence surgery  12/07  . Knee arthroscopy     History   Social History  . Marital Status: Married    Spouse Name: Rocky LinkKen    Number of Children: 0  . Years of Education: Master's   Occupational History  . SPEECH PATHOLOGIST    Social History Main Topics  . Smoking status: Never Smoker   . Smokeless tobacco: Never Used  . Alcohol Use: No  . Drug Use: No  . Sexual Activity:    Partners: Male   Other Topics Concern  . Not on file   Social History Narrative   Family History  Problem Relation Age of Onset  . Hypertension Father   . Hyperlipidemia Father    Patient Active Problem List   Diagnosis Date Noted  . Endometriosis 05/30/2013  . DJD (degenerative joint disease) of knee 04/14/2012  . Other and unspecified hyperlipidemia 04/14/2012  . S/P hysterectomy 04/14/2012  . History of hysterectomy, supracervical 04/14/2012  . Psoriasis 04/14/2012  . SK (seborrheic keratosis) 04/14/2012   No current outpatient prescriptions on file prior to visit.   No current facility-administered medications on file  prior to visit.      Review of Systems    see HPI Objective:   Physical Exam Physical Exam  Nursing note and vitals reviewed.  Constitutional: She is oriented to person, place, and time. She appears well-developed and well-nourished.  HENT:  Head: Normocephalic and atraumatic.  Eyes.  Minimal edema Right eyelid.  She has small lesion on upper eyelid.  Parotid swelling R side  Firm parotid gland Neck  No ant cervical adenopathy O/P no redness or lesions Cardiovascular: Normal rate and regular rhythm. Exam reveals no gallop and no friction rub.  No murmur heard.  Pulmonary/Chest: Breath sounds normal. She has no wheezes. She has no rales.  Neurological: She is alert and oriented to person, place, and time.  Skin: Skin is warm and dry.  Psychiatric: She has a normal mood and affect. Her behavior is normal.         Assessment & Plan:  Parotitis  Keep taking amoxicillin.  DDX wide including viral or bacterial parotitis,  Stone,  Sarcoid,  Etc.  Will get facial CT and further management based  On results  Edema of eyelid.  Ok for HC 2.5 % once a day   Will give ENT  referral

## 2014-01-03 ENCOUNTER — Encounter: Payer: Self-pay | Admitting: *Deleted

## 2014-01-03 LAB — CBC WITH DIFFERENTIAL/PLATELET
Basophils Absolute: 0 10*3/uL (ref 0.0–0.1)
Basophils Relative: 0 % (ref 0–1)
EOS PCT: 1 % (ref 0–5)
Eosinophils Absolute: 0.1 10*3/uL (ref 0.0–0.7)
HEMATOCRIT: 44.4 % (ref 36.0–46.0)
HEMOGLOBIN: 16.1 g/dL — AB (ref 12.0–15.0)
LYMPHS ABS: 1.6 10*3/uL (ref 0.7–4.0)
LYMPHS PCT: 24 % (ref 12–46)
MCH: 32.9 pg (ref 26.0–34.0)
MCHC: 36.3 g/dL — AB (ref 30.0–36.0)
MCV: 90.6 fL (ref 78.0–100.0)
MONOS PCT: 8 % (ref 3–12)
MPV: 10.4 fL (ref 9.4–12.4)
Monocytes Absolute: 0.5 10*3/uL (ref 0.1–1.0)
Neutro Abs: 4.6 10*3/uL (ref 1.7–7.7)
Neutrophils Relative %: 67 % (ref 43–77)
Platelets: 207 10*3/uL (ref 150–400)
RBC: 4.9 MIL/uL (ref 3.87–5.11)
RDW: 14.2 % (ref 11.5–15.5)
WBC: 6.8 10*3/uL (ref 4.0–10.5)

## 2014-01-03 LAB — ANGIOTENSIN CONVERTING ENZYME: Angiotensin-Converting Enzyme: 58 U/L — ABNORMAL HIGH (ref 8–52)

## 2014-01-03 LAB — ANA: ANA: NEGATIVE

## 2014-01-04 ENCOUNTER — Telehealth: Payer: Self-pay | Admitting: Internal Medicine

## 2014-01-04 NOTE — Telephone Encounter (Signed)
Spoke with pt  She did see Dr. Pollyann Kennedyosen yesterday and he went over CT results with her .  She is aware to follow up with his office if there is any worsening  She tells me her eye is more swollen today and drainin some .  I advised pt she will need opthalmology eval  She is a patient of [facility] in [place].  Pt will call and get appt to be seen today .  Advised if any problem getting appt today she is to notify my office .  She voices understanding    I reviewed lab with her.   Brittany Roberson call pt this afternoon and tell her that I wish to see her next week in office to check on her .  Give her a 30 min appt next week

## 2014-01-05 ENCOUNTER — Other Ambulatory Visit: Payer: Self-pay | Admitting: *Deleted

## 2014-01-05 NOTE — Telephone Encounter (Signed)
[name] called and said that her rash is getting worse and is blistering and has become painful. I advised her to go to urgent care as soon as possible since Dr. Constance GoltzSchoenhoff will not be back in the office till Monday. Patient voiced Brittany Aidunderstanding-eh

## 2014-01-06 ENCOUNTER — Telehealth: Payer: Self-pay | Admitting: Internal Medicine

## 2014-01-11 ENCOUNTER — Encounter: Payer: Self-pay | Admitting: Internal Medicine

## 2014-01-11 ENCOUNTER — Ambulatory Visit (INDEPENDENT_AMBULATORY_CARE_PROVIDER_SITE_OTHER): Payer: BC Managed Care – PPO | Admitting: Internal Medicine

## 2014-01-11 VITALS — BP 142/80 | HR 82 | Temp 98.1°F | Resp 16 | Ht 63.0 in | Wt 195.0 lb

## 2014-01-11 DIAGNOSIS — I10 Essential (primary) hypertension: Secondary | ICD-10-CM | POA: Diagnosis not present

## 2014-01-11 DIAGNOSIS — R609 Edema, unspecified: Secondary | ICD-10-CM

## 2014-01-11 DIAGNOSIS — B029 Zoster without complications: Secondary | ICD-10-CM | POA: Diagnosis not present

## 2014-01-11 MED ORDER — HYDROCHLOROTHIAZIDE 12.5 MG PO CAPS: 12.5000 mg | ORAL_CAPSULE | Freq: Every day | ORAL | Status: DC

## 2014-01-11 NOTE — Progress Notes (Signed)
Subjective:    Patient ID: Brittany LarsenBarbara Roberson, female    DOB: 1960/07/17, 53 y.o.   MRN: [id number]  HPI Brittany MccreedyBarbara is here for follow up  Since last visit, her rash progressed to blisters along dermatomal pattern consistant with shingles.   She went to UC where she was started on  Famcyclovir,  Keflex, ,and narcotic  Feeling much better still itching  Does not like to take pain meds  She does have headache off and on.   See BP  Pt tells me that her She has been told at UC last two visits that her BP has been above 140.    No visual no speech changes.  Some nausea when she takes narcotic.    NO chest pain . She does have chronic edema of hands and feet .  FH of HTN in father   Allergies  Allergen Reactions  . Shellfish Allergy Itching    flushing   Past Medical History  Diagnosis Date  . Allergy   . Arthritis     knees  . Menopause   . Hyperlipidemia   . Endometriosis   . Obesity    Past Surgical History  Procedure Laterality Date  . Abdominal hysterectomy      2007  . Carpal tunnel repair  2/11    Right  . Menicus repair      rt.knee-11/2009  . Incontinence surgery  12/07  . Knee arthroscopy     History   Social History  . Marital Status: Married    Spouse Name: Rocky LinkKen    Number of Children: 0  . Years of Education: Master's   Occupational History  . SPEECH PATHOLOGIST    Social History Main Topics  . Smoking status: Never Smoker   . Smokeless tobacco: Never Used  . Alcohol Use: No  . Drug Use: No  . Sexual Activity:    Partners: Male   Other Topics Concern  . Not on file   Social History Narrative   Family History  Problem Relation Age of Onset  . Hypertension Father   . Hyperlipidemia Father    Patient Active Problem List   Diagnosis Date Noted  . Endometriosis 05/30/2013  . DJD (degenerative joint disease) of knee 04/14/2012  . Other and unspecified hyperlipidemia 04/14/2012  . S/P hysterectomy 04/14/2012  . History of hysterectomy,  supracervical 04/14/2012  . Psoriasis 04/14/2012  . SK (seborrheic keratosis) 04/14/2012   Current Outpatient Prescriptions on File Prior to Visit  Medication Sig Dispense Refill  . amoxicillin (AMOXIL) 500 MG tablet Take 500 mg by mouth 3 (three) times daily.    . hydrocortisone 2.5 % cream Apply topically 2 (two) times daily. Apply topically once a day to affected area 30 g 0   No current facility-administered medications on file prior to visit.       Review of Systems    see HPI Objective:   Physical Exam Physical Exam  Nursing note and vitals reviewed.   Repeat BP  142/80 Constitutional: She is oriented to person, place, and time. She appears well-developed and well-nourished.  HENT:  Head: Normocephalic and atraumatic.  Cardiovascular: Normal rate and regular rhythm. Exam reveals no gallop and no friction rub.  No murmur heard.  Pulmonary/Chest: Breath sounds normal. She has no wheezes. She has no rales.  Neurological: She is alert and oriented to person, place, and time.  Skin: Skin is warm and dry.  Drying blisters forehead and one on upper eyelid Psychiatric: She  has a normal mood and affect. Her behavior is normal.        Assessment & Plan:  Facial Zoster  I had advised pt via telephone on last visit to see her opthalmologist ,.  I counseled of rare complication of herpetic encephalitis but clinically does not fit with this picture now.  If headache worsening,  Onset of vomiting, dizziness, visual or personality change.  She is to contact my office or go to UC.   HTN:  Will start on very low dose of HCTZ 12.5 mg  See me next week

## 2014-01-18 ENCOUNTER — Ambulatory Visit (INDEPENDENT_AMBULATORY_CARE_PROVIDER_SITE_OTHER): Payer: BC Managed Care – PPO | Admitting: Internal Medicine

## 2014-01-18 ENCOUNTER — Encounter: Payer: Self-pay | Admitting: Internal Medicine

## 2014-01-18 VITALS — BP 130/73 | HR 79 | Temp 98.5°F | Resp 16 | Ht 63.0 in | Wt 194.0 lb

## 2014-01-18 DIAGNOSIS — B029 Zoster without complications: Secondary | ICD-10-CM

## 2014-01-18 DIAGNOSIS — G441 Vascular headache, not elsewhere classified: Secondary | ICD-10-CM

## 2014-01-18 DIAGNOSIS — I10 Essential (primary) hypertension: Secondary | ICD-10-CM

## 2014-01-18 NOTE — Progress Notes (Signed)
Subjective:    Patient ID: Brittany LarsenBarbara Roberson, female    DOB: [birth year], 53 y.o.   MRN: [id number]  HPI 11/25 Facial Zoster. I had advised pt via telephone on last visit to see her opthalmologist ,. I counseled of rare complication of herpetic encephalitis but clinically does not fit with this picture now. If headache worsening, Onset of vomiting, dizziness, visual or personality change. She is to contact my office or go to UC.   HTN: Will start on very low dose of HCTZ 12.5 mg See me next week   TODAY   Brittany MccreedyBarbara returns after initiation of  HCTZ 12.5 mg    For HTN  Doing well has not urinated much  Headache not as frequent  Facial Zoster  healing  Allergies  Allergen Reactions  . Shellfish Allergy Itching    flushing   Past Medical History  Diagnosis Date  . Allergy   . Arthritis     knees  . Menopause   . Hyperlipidemia   . Endometriosis   . Obesity    Past Surgical History  Procedure Laterality Date  . Abdominal hysterectomy      2007  . Carpal tunnel repair  2/11    Right  . Menicus repair      rt.knee-11/2009  . Incontinence surgery  12/07  . Knee arthroscopy     History   Social History  . Marital Status: Married    Spouse Name: Rocky LinkKen    Number of Children: 0  . Years of Education: Master's   Occupational History  . SPEECH PATHOLOGIST    Social History Main Topics  . Smoking status: Never Smoker   . Smokeless tobacco: Never Used  . Alcohol Use: No  . Drug Use: No  . Sexual Activity:    Partners: Male   Other Topics Concern  . Not on file   Social History Narrative   Family History  Problem Relation Age of Onset  . Hypertension Father   . Hyperlipidemia Father    Patient Active Problem List   Diagnosis Date Noted  . HTN (hypertension) 01/11/2014  . Herpes zoster 01/11/2014  . Endometriosis 05/30/2013  . DJD (degenerative joint disease) of knee 04/14/2012  . Other and unspecified hyperlipidemia 04/14/2012  . S/P hysterectomy  04/14/2012  . History of hysterectomy, supracervical 04/14/2012  . Psoriasis 04/14/2012  . SK (seborrheic keratosis) 04/14/2012   Current Outpatient Prescriptions on File Prior to Visit  Medication Sig Dispense Refill  . cephALEXin (KEFLEX) 500 MG capsule Take by mouth.    . hydrochlorothiazide (MICROZIDE) 12.5 MG capsule Take 1 capsule (12.5 mg total) by mouth daily. 30 capsule 1   No current facility-administered medications on file prior to visit.       Review of Systems See HPI    Objective:   Physical Exam Physical Exam  Nursing note and vitals reviewed.   Repeat BP  128/76 Constitutional: She is oriented to person, place, and time. She appears well-developed and well-nourished.  HENT:  Head: Normocephalic and atraumatic.  Cardiovascular: Normal rate and regular rhythm. Exam reveals no gallop and no friction rub.  No murmur heard.  Pulmonary/Chest: Breath sounds normal. She has no wheezes. She has no rales.  Neurological: She is alert and oriented to person, place, and time.  Skin: Skin is warm and dry.  Facial zoster healing Psychiatric: She has a normal mood and affect. Her behavior is normal.          Assessment & Plan:  HTn  Continue hctz 12.5 mg.  Check K today  Hadache improving   Facial zoster  improving

## 2014-01-18 NOTE — Patient Instructions (Signed)
See me at CPE 

## 2014-01-19 ENCOUNTER — Encounter: Payer: Self-pay | Admitting: Internal Medicine

## 2014-01-19 LAB — BASIC METABOLIC PANEL
BUN: 14 mg/dL (ref 6–23)
CALCIUM: 9.3 mg/dL (ref 8.4–10.5)
CO2: 30 mEq/L (ref 19–32)
Chloride: 100 mEq/L (ref 96–112)
Creat: 0.73 mg/dL (ref 0.50–1.10)
GLUCOSE: 88 mg/dL (ref 70–99)
Potassium: 3.7 mEq/L (ref 3.5–5.3)
Sodium: 138 mEq/L (ref 135–145)

## 2014-01-19 MED ORDER — POTASSIUM CHLORIDE CRYS ER 20 MEQ PO TBCR: 20.0000 meq | EXTENDED_RELEASE_TABLET | Freq: Every day | ORAL | Status: DC

## 2014-01-19 NOTE — Progress Notes (Signed)
Brittany MccreedyBarbara is aware of her lab results. She will pick up the RX for the K at [facility]

## 2014-01-19 NOTE — Addendum Note (Signed)
Addended by: Raechel ChuteSCHOENHOFF, DEBORAH D on: 01/19/2014 03:50 PM   Modules accepted: Orders

## 2014-02-07 ENCOUNTER — Ambulatory Visit (HOSPITAL_BASED_OUTPATIENT_CLINIC_OR_DEPARTMENT_OTHER)
Admission: RE | Admit: 2014-02-07 | Discharge: 2014-02-07 | Disposition: A | Discharge status: Home / Self Care | Payer: BC Managed Care – PPO | Source: Ambulatory Visit | Attending: Internal Medicine | Admitting: Internal Medicine

## 2014-02-07 ENCOUNTER — Other Ambulatory Visit: Payer: Self-pay | Admitting: Internal Medicine

## 2014-02-07 DIAGNOSIS — Z1231 Encounter for screening mammogram for malignant neoplasm of breast: Secondary | ICD-10-CM | POA: Diagnosis not present

## 2014-03-22 ENCOUNTER — Ambulatory Visit (INDEPENDENT_AMBULATORY_CARE_PROVIDER_SITE_OTHER): Payer: BC Managed Care – PPO | Admitting: Internal Medicine

## 2014-03-22 ENCOUNTER — Encounter: Payer: Self-pay | Admitting: Internal Medicine

## 2014-03-22 VITALS — BP 113/67 | HR 68 | Resp 16 | Ht 63.0 in | Wt 190.0 lb

## 2014-03-22 DIAGNOSIS — R634 Abnormal weight loss: Secondary | ICD-10-CM

## 2014-03-22 DIAGNOSIS — I10 Essential (primary) hypertension: Secondary | ICD-10-CM | POA: Diagnosis not present

## 2014-03-22 MED ORDER — HYDROCHLOROTHIAZIDE 12.5 MG PO CAPS: 12.5000 mg | ORAL_CAPSULE | Freq: Every day | ORAL | Status: DC

## 2014-03-22 MED ORDER — POTASSIUM CHLORIDE CRYS ER 20 MEQ PO TBCR: EXTENDED_RELEASE_TABLET | ORAL | Status: DC

## 2014-03-22 NOTE — Addendum Note (Signed)
Addended by: Raechel ChuteSCHOENHOFF, DEBORAH D on: 03/22/2014 04:39 PM   Modules accepted: Orders

## 2014-03-22 NOTE — Progress Notes (Signed)
Subjective:    Patient ID: Brittany Roberson, female    DOB: 1960-11-06, 54 y.o.   MRN: [id number]  HPI 01/18/2014 visit  HTn Continue hctz 12.5 mg. Check K today  Hadache improving   Facial zoster improving             Brittany Roberson, CMA at 01/19/2014 3:54 PM     Status: Signed       Expand All Collapse All   Brittany Roberson is aware of her lab results. She will pick up the RX for the K at [facility]         TODAY  Brittany Roberson is here for follow up.   She has lost 5 lbs since NOvember and is on  The 56 days diet  ( I do not know what this is)    She is taking her K   Allergies  Allergen Reactions  . Shellfish Allergy Itching    flushing   Past Medical History  Diagnosis Date  . Allergy   . Arthritis     knees  . Menopause   . Hyperlipidemia   . Endometriosis   . Obesity    Past Surgical History  Procedure Laterality Date  . Abdominal hysterectomy      2007  . Carpal tunnel repair  2/11    Right  . Menicus repair      rt.knee-11/2009  . Incontinence surgery  12/07  . Knee arthroscopy     History   Social History  . Marital Status: Married    Spouse Name: Brittany Roberson    Number of Children: 0  . Years of Education: Master's   Occupational History  . SPEECH PATHOLOGIST    Social History Main Topics  . Smoking status: Never Smoker   . Smokeless tobacco: Never Used  . Alcohol Use: No  . Drug Use: No  . Sexual Activity:    Partners: Male   Other Topics Concern  . Not on file   Social History Narrative   Family History  Problem Relation Age of Onset  . Hypertension Father   . Hyperlipidemia Father    Patient Active Problem List   Diagnosis Date Noted  . HTN (hypertension) 01/11/2014  . Herpes zoster 01/11/2014  . Endometriosis 05/30/2013  . DJD (degenerative joint disease) of knee 04/14/2012  . Other and unspecified hyperlipidemia 04/14/2012  . S/P hysterectomy 04/14/2012  . History of hysterectomy, supracervical 04/14/2012  . Psoriasis  04/14/2012  . SK (seborrheic keratosis) 04/14/2012   Current Outpatient Prescriptions on File Prior to Visit  Medication Sig Dispense Refill  . hydrochlorothiazide (MICROZIDE) 12.5 MG capsule Take 1 capsule (12.5 mg total) by mouth daily. 30 capsule 1  . potassium chloride SA (K-DUR,KLOR-CON) 20 MEQ tablet Take 1 tablet (20 mEq total) by mouth daily. 30 tablet 3   No current facility-administered medications on file prior to visit.       Review of Systems See HPI     Objective:   Physical Exam Physical Exam  Nursing note and vitals reviewed.  Constitutional: She is oriented to person, place, and time. She appears well-developed and well-nourished.  HENT:  Head: Normocephalic and atraumatic.  Cardiovascular: Normal rate and regular rhythm. Exam reveals no gallop and no friction rub.  No murmur heard.  Pulmonary/Chest: Breath sounds normal. She has no wheezes. She has no rales.  Neurological: She is alert and oriented to person, place, and time.  Skin: Skin is warm and dry.  Psychiatric: She has a  normal mood and affect. Her behavior is normal.       Assessment & Plan:  HTN  Well controlled on minimum  HCTZ      Intentional  Weight loss    If continues my be able to try to come off meds    See me at CPE

## 2014-06-07 ENCOUNTER — Encounter: Payer: BC Managed Care – PPO | Admitting: Internal Medicine

## 2014-08-09 ENCOUNTER — Encounter: Payer: BC Managed Care – PPO | Admitting: Internal Medicine

## 2015-04-18 ENCOUNTER — Other Ambulatory Visit: Payer: Self-pay | Admitting: Internal Medicine

## 2015-04-18 ENCOUNTER — Other Ambulatory Visit (HOSPITAL_COMMUNITY)
Admission: RE | Admit: 2015-04-18 | Discharge: 2015-04-18 | Disposition: A | Discharge status: Home / Self Care | Payer: BC Managed Care – PPO | Source: Ambulatory Visit | Attending: Internal Medicine | Admitting: Internal Medicine

## 2015-04-18 DIAGNOSIS — Z01419 Encounter for gynecological examination (general) (routine) without abnormal findings: Secondary | ICD-10-CM | POA: Diagnosis present

## 2015-04-18 DIAGNOSIS — Z1151 Encounter for screening for human papillomavirus (HPV): Secondary | ICD-10-CM | POA: Insufficient documentation

## 2015-04-20 LAB — CYTOLOGY - PAP

## 2015-07-19 NOTE — H&P (Signed)
TOTAL KNEE ADMISSION H&P  Patient is being admitted for right total knee arthroplasty.  Subjective:  Chief Complaint:   Right knee primary OA / pain  HPI: Brittany Roberson, 55 y.o. female, has a history of pain and functional disability in the right knee due to arthritis and has failed non-surgical conservative treatments for greater than 12 weeks to include NSAID's and/or analgesics, corticosteriod injections, viscosupplementation injections and activity modification.  Onset of symptoms was abrupt, starting 8+ years ago with gradually worsening course since that time. The patient noted prior procedures on the knee to include  arthroscopy and menisectomy on the right knee(s).  Patient currently rates pain in the right knee(s) at 6 out of 10 with activity. Patient has night pain, worsening of pain with activity and weight bearing, pain that interferes with activities of daily living, pain with passive range of motion, crepitus and joint swelling.  Patient has evidence of periarticular osteophytes and joint space narrowing by imaging studies.  There is no active infection.  Risks, benefits and expectations were discussed with the patient.  Risks including but not limited to the risk of anesthesia, blood clots, nerve damage, blood vessel damage, failure of the prosthesis, infection and up to and including death.  Patient understand the risks, benefits and expectations and wishes to proceed with surgery.   PCP: Levon Hedger, MD  D/C Plans:      Home with HHPT  Post-op Meds:       No Rx given  Tranexamic Acid:      To be given - IV   Decadron:      Is to be given  FYI:     ASA  Norco  Script given for DME     Patient Active Problem List   Diagnosis Date Noted  . HTN (hypertension) 01/11/2014  . Herpes zoster 01/11/2014  . Endometriosis 05/30/2013  . DJD (degenerative joint disease) of knee 04/14/2012  . Other and unspecified hyperlipidemia 04/14/2012  . S/P hysterectomy 04/14/2012  .  History of hysterectomy, supracervical 04/14/2012  . Psoriasis 04/14/2012  . SK (seborrheic keratosis) 04/14/2012   Past Medical History  Diagnosis Date  . Allergy   . Arthritis     knees  . Menopause   . Hyperlipidemia   . Endometriosis   . Obesity     Past Surgical History  Procedure Laterality Date  . Abdominal hysterectomy      2007  . Carpal tunnel repair  2/11    Right  . Menicus repair      rt.knee-11/2009  . Incontinence surgery  12/07  . Knee arthroscopy      No prescriptions prior to admission   Allergies  Allergen Reactions  . Shellfish Allergy Itching and Other (See Comments)    flushing    Social History  Substance Use Topics  . Smoking status: Never Smoker   . Smokeless tobacco: Never Used  . Alcohol Use: No    Family History  Problem Relation Age of Onset  . Hypertension Father   . Hyperlipidemia Father      Review of Systems  Constitutional: Negative.   HENT: Negative.   Eyes: Negative.   Respiratory: Negative.   Cardiovascular: Negative.   Gastrointestinal: Negative.   Genitourinary: Negative.   Musculoskeletal: Positive for joint pain.  Skin: Negative.   Neurological: Negative.   Endo/Heme/Allergies: Positive for environmental allergies.  Psychiatric/Behavioral: Negative.     Objective:  Physical Exam  Constitutional: She is oriented to person, place, and time.  She appears well-developed.  HENT:  Head: Normocephalic.  Eyes: Pupils are equal, round, and reactive to light.  Neck: Neck supple. No JVD present. No tracheal deviation present. No thyromegaly present.  Cardiovascular: Normal rate, regular rhythm, normal heart sounds and intact distal pulses.   Respiratory: Effort normal and breath sounds normal. No stridor. No respiratory distress. She has no wheezes.  GI: Soft. There is no tenderness. There is no guarding.  Musculoskeletal:       Right knee: She exhibits decreased range of motion, swelling and bony tenderness. She  exhibits no ecchymosis, no deformity, no laceration and no erythema. Tenderness found.  Lymphadenopathy:    She has no cervical adenopathy.  Neurological: She is alert and oriented to person, place, and time.  Skin: Skin is warm and dry.  Psychiatric: She has a normal mood and affect.      Labs:  Estimated body mass index is 33.67 kg/(m^2) as calculated from the following:   Height as of 03/22/14: 5\' 3"  (1.6 m).   Weight as of 03/22/14: 86.183 kg (190 lb).   Imaging Review Plain radiographs demonstrate severe degenerative joint disease of the right knee(s).  The bone quality appears to be good for age and reported activity level.  Assessment/Plan:  End stage arthritis, right knee   The patient history, physical examination, clinical judgment of the provider and imaging studies are consistent with end stage degenerative joint disease of the right knee(s) and total knee arthroplasty is deemed medically necessary. The treatment options including medical management, injection therapy arthroscopy and arthroplasty were discussed at length. The risks and benefits of total knee arthroplasty were presented and reviewed. The risks due to aseptic loosening, infection, stiffness, patella tracking problems, thromboembolic complications and other imponderables were discussed. The patient acknowledged the explanation, agreed to proceed with the plan and consent was signed. Patient is being admitted for inpatient treatment for surgery, pain control, PT, OT, prophylactic antibiotics, VTE prophylaxis, progressive ambulation and ADL's and discharge planning. The patient is planning to be discharged home with home health services.      Anastasio AuerbachMatthew S. Jamien Casanova   PA-C  07/19/2015, 9:18 AM

## 2015-07-23 NOTE — Patient Instructions (Signed)
Brittany Roberson  07/23/2015   Your procedure is scheduled on: 07/31/2015    Report to [facility] Main  Entrance take [place]  elevators to 3rd floor to  Short Stay Center at    0700 AM.  Call this number if you have problems the morning of surgery [phone]   Remember: ONLY 1 PERSON MAY GO WITH YOU TO SHORT STAY TO GET  READY MORNING OF YOUR SURGERY.  Do not eat food or drink liquids :After Midnight.     Take these medicines the morning of surgery with A SIP OF WATER: none                                 You may not have any metal on your body including hair pins and              piercings  Do not wear jewelry, make-up, lotions, powders or perfumes, deodorant             Do not wear nail polish.  Do not shave  48 hours prior to surgery.               Do not bring valuables to the hospital. [facility] IS NOT             RESPONSIBLE   FOR VALUABLES.  Contacts, dentures or bridgework may not be worn into surgery.  Leave suitcase in the car. After surgery it may be brought to your room.         Special Instructions: coughing and deep breathing exercises, leg exercises  WHAT IS A BLOOD TRANSFUSION? Blood Transfusion Information  A transfusion is the replacement of blood or some of its parts. Blood is made up of multiple cells which provide different functions.  Red blood cells carry oxygen and are used for blood loss replacement.  White blood cells fight against infection.  Platelets control bleeding.  Plasma helps clot blood.  Other blood products are available for specialized needs, such as hemophilia or other clotting disorders. BEFORE THE TRANSFUSION  Who gives blood for transfusions?   Healthy volunteers who are fully evaluated to make sure their blood is safe. This is blood bank blood. Transfusion therapy is the safest it has ever been in the practice of medicine. Before blood is taken from a donor, a complete history is taken to make sure that  person has no history of diseases nor engages in risky social behavior (examples are intravenous drug use or sexual activity with multiple partners). The donor's travel history is screened to minimize risk of transmitting infections, such as malaria. The donated blood is tested for signs of infectious diseases, such as HIV and hepatitis. The blood is then tested to be sure it is compatible with you in order to minimize the chance of a transfusion reaction. If you or a relative donates blood, this is often done in anticipation of surgery and is not appropriate for emergency situations. It takes many days to process the donated blood. RISKS AND COMPLICATIONS Although transfusion therapy is very safe and saves many lives, the main dangers of transfusion include:  1. Getting an infectious disease. 2. Developing a transfusion reaction. This is an allergic reaction to something in the blood you were given. Every precaution is taken to prevent this. The decision to have a blood transfusion  has been considered carefully by your caregiver before blood is given. Blood is not given unless the benefits outweigh the risks. AFTER THE TRANSFUSION  Right after receiving a blood transfusion, you will usually feel much better and more energetic. This is especially true if your red blood cells have gotten low (anemic). The transfusion raises the level of the red blood cells which carry oxygen, and this usually causes an energy increase.  The nurse administering the transfusion will monitor you carefully for complications. HOME CARE INSTRUCTIONS  No special instructions are needed after a transfusion. You may find your energy is better. Speak with your caregiver about any limitations on activity for underlying diseases you may have. SEEK MEDICAL CARE IF:   Your condition is not improving after your transfusion.  You develop redness or irritation at the intravenous (IV) site. SEEK IMMEDIATE MEDICAL CARE IF:  Any of  the following symptoms occur over the next 12 hours:  Shaking chills.  You have a temperature by mouth above 102 F (38.9 C), not controlled by medicine.  Chest, back, or muscle pain.  People around you feel you are not acting correctly or are confused.  Shortness of breath or difficulty breathing.  Dizziness and fainting.  You get a rash or develop hives.  You have a decrease in urine output.  Your urine turns a dark color or changes to pink, red, or brown. Any of the following symptoms occur over the next 10 days:  You have a temperature by mouth above 102 F (38.9 C), not controlled by medicine.  Shortness of breath.  Weakness after normal activity.  The white part of the eye turns yellow (jaundice).  You have a decrease in the amount of urine or are urinating less often.  Your urine turns a dark color or changes to pink, red, or brown. Document Released: 02/01/2000 Document Revised: 04/28/2011 Document Reviewed: 09/20/2007 ExitCare Patient Information 2014 [place], [place].  _______________________________________________________________________  Incentive Spirometer  An incentive spirometer is a tool that can help keep your lungs clear and active. This tool measures how well you are filling your lungs with each breath. Taking long deep breaths may help reverse or decrease the chance of developing breathing (pulmonary) problems (especially infection) following:  A long period of time when you are unable to move or be active. BEFORE THE PROCEDURE   If the spirometer includes an indicator to show your best effort, your nurse or respiratory therapist will set it to a desired goal.  If possible, sit up straight or lean slightly forward. Try not to slouch.  Hold the incentive spirometer in an upright position. INSTRUCTIONS FOR USE  3. Sit on the edge of your bed if possible, or sit up as far as you can in bed or on a chair. 4. Hold the incentive spirometer in an  upright position. 5. Breathe out normally. 6. Place the mouthpiece in your mouth and seal your lips tightly around it. 7. Breathe in slowly and as deeply as possible, raising the piston or the ball toward the top of the column. 8. Hold your breath for 3-5 seconds or for as long as possible. Allow the piston or ball to fall to the bottom of the column. 9. Remove the mouthpiece from your mouth and breathe out normally. 10. Rest for a few seconds and repeat Steps 1 through 7 at least 10 times every 1-2 hours when you are awake. Take your time and take a few normal breaths between deep breaths. 11.  The spirometer may include an indicator to show your best effort. Use the indicator as a goal to work toward during each repetition. 12. After each set of 10 deep breaths, practice coughing to be sure your lungs are clear. If you have an incision (the cut made at the time of surgery), support your incision when coughing by placing a pillow or rolled up towels firmly against it. Once you are able to get out of bed, walk around indoors and cough well. You may stop using the incentive spirometer when instructed by your caregiver.  RISKS AND COMPLICATIONS  Take your time so you do not get dizzy or light-headed.  If you are in pain, you may need to take or ask for pain medication before doing incentive spirometry. It is harder to take a deep breath if you are having pain. AFTER USE  Rest and breathe slowly and easily.  It can be helpful to keep track of a log of your progress. Your caregiver can provide you with a simple table to help with this. If you are using the spirometer at home, follow these instructions: SEEK MEDICAL CARE IF:   You are having difficultly using the spirometer.  You have trouble using the spirometer as often as instructed.  Your pain medication is not giving enough relief while using the spirometer.  You develop fever of 100.5 F (38.1 C) or higher. SEEK IMMEDIATE MEDICAL CARE  IF:   You cough up bloody sputum that had not been present before.  You develop fever of 102 F (38.9 C) or greater.  You develop worsening pain at or near the incision site. MAKE SURE YOU:   Understand these instructions.  Will watch your condition.  Will get help right away if you are not doing well or get worse. Document Released: 06/16/2006 Document Revised: 04/28/2011 Document Reviewed: 08/17/2006 ExitCare Patient Information 2014 [employer].   ________________________________________________________________________               Please read over the following fact sheets you were given: _____________________________________________________________________             [facility] - Preparing [facility] Before surgery, you can play an important role.  Because skin is not sterile, your skin needs to be as free of germs as possible.  You can reduce the number of germs on your skin by washing with CHG (chlorahexidine gluconate) soap before surgery.  CHG is an antiseptic cleaner which kills germs and bonds with the skin to continue killing germs even after washing. Please DO NOT use if you have an allergy to CHG or antibacterial soaps.  If your skin becomes reddened/irritated stop using the CHG and inform your nurse when you arrive at Short Stay. Do not shave (including legs and underarms) for at least 48 hours prior to the first CHG shower.  You may shave your face/neck. Please follow these instructions carefully:  1.  Shower with CHG Soap the night before surgery and the  morning of Surgery.  2.  If you choose to wash your hair, wash your hair first as usual with your  normal  shampoo.  3.  After you shampoo, rinse your hair and body thoroughly to remove the  shampoo.                           4.  Use CHG as you would any other liquid soap.  You can apply chg directly  to  the skin and wash                       Gently with a scrungie or clean washcloth.  5.  Apply the CHG  Soap to your body ONLY FROM THE NECK DOWN.   Do not use on face/ open                           Wound or open sores. Avoid contact with eyes, ears mouth and genitals (private parts).                       Wash face,  Genitals (private parts) with your normal soap.             6.  Wash thoroughly, paying special attention to the area where your surgery  will be performed.  7.  Thoroughly rinse your body with warm water from the neck down.  8.  DO NOT shower/wash with your normal soap after using and rinsing off  the CHG Soap.                9.  Pat yourself dry with a clean towel.            10.  Wear clean pajamas.            11.  Place clean sheets on your bed the night of your first shower and do not  sleep with pets. Day of Surgery : Do not apply any lotions/deodorants the morning of surgery.  Please wear clean clothes to the hospital/surgery center.  FAILURE TO FOLLOW THESE INSTRUCTIONS MAY RESULT IN THE CANCELLATION OF YOUR SURGERY PATIENT SIGNATURE_________________________________  NURSE SIGNATURE__________________________________  ________________________________________________________________________

## 2015-07-24 ENCOUNTER — Encounter (HOSPITAL_COMMUNITY): Payer: Self-pay

## 2015-07-24 ENCOUNTER — Encounter (HOSPITAL_COMMUNITY)
Admission: RE | Admit: 2015-07-24 | Discharge: 2015-07-24 | Disposition: A | Discharge status: Home / Self Care | Payer: BC Managed Care – PPO | Source: Ambulatory Visit | Attending: Orthopedic Surgery | Admitting: Orthopedic Surgery

## 2015-07-24 DIAGNOSIS — Z01812 Encounter for preprocedural laboratory examination: Secondary | ICD-10-CM | POA: Diagnosis not present

## 2015-07-24 DIAGNOSIS — M1711 Unilateral primary osteoarthritis, right knee: Secondary | ICD-10-CM | POA: Insufficient documentation

## 2015-07-24 DIAGNOSIS — R52 Pain, unspecified: Secondary | ICD-10-CM | POA: Diagnosis not present

## 2015-07-24 LAB — CBC
HCT: 41.4 % (ref 36.0–46.0)
Hemoglobin: 15.3 g/dL — ABNORMAL HIGH (ref 12.0–15.0)
MCH: 32.5 pg (ref 26.0–34.0)
MCHC: 37 g/dL — ABNORMAL HIGH (ref 30.0–36.0)
MCV: 87.9 fL (ref 78.0–100.0)
PLATELETS: 244 10*3/uL (ref 150–400)
RBC: 4.71 MIL/uL (ref 3.87–5.11)
RDW: 12.9 % (ref 11.5–15.5)
WBC: 7.3 10*3/uL (ref 4.0–10.5)

## 2015-07-24 LAB — ABO/RH: ABO/RH(D): B POS

## 2015-07-24 LAB — SURGICAL PCR SCREEN
MRSA, PCR: NEGATIVE
STAPHYLOCOCCUS AUREUS: POSITIVE — AB

## 2015-07-24 NOTE — Progress Notes (Signed)
Clearance- Dr Josepha PiggSchonehoff on chart

## 2015-07-31 ENCOUNTER — Inpatient Hospital Stay (HOSPITAL_COMMUNITY): Payer: BC Managed Care – PPO | Admitting: Anesthesiology

## 2015-07-31 ENCOUNTER — Encounter (HOSPITAL_COMMUNITY): Payer: Self-pay | Admitting: *Deleted

## 2015-07-31 ENCOUNTER — Encounter (HOSPITAL_COMMUNITY)
Admission: RE | Disposition: A | Discharge status: Home Health | Payer: Self-pay | Source: Ambulatory Visit | Attending: Orthopedic Surgery

## 2015-07-31 ENCOUNTER — Inpatient Hospital Stay (HOSPITAL_COMMUNITY)
Admission: RE | Admit: 2015-07-31 | Discharge: 2015-08-01 | DRG: 470 | Disposition: A | Discharge status: Home Health | Payer: BC Managed Care – PPO | Source: Ambulatory Visit | Attending: Orthopedic Surgery | Admitting: Orthopedic Surgery

## 2015-07-31 DIAGNOSIS — E669 Obesity, unspecified: Secondary | ICD-10-CM | POA: Diagnosis present

## 2015-07-31 DIAGNOSIS — M25461 Effusion, right knee: Secondary | ICD-10-CM | POA: Diagnosis present

## 2015-07-31 DIAGNOSIS — E785 Hyperlipidemia, unspecified: Secondary | ICD-10-CM | POA: Diagnosis present

## 2015-07-31 DIAGNOSIS — Z90710 Acquired absence of both cervix and uterus: Secondary | ICD-10-CM

## 2015-07-31 DIAGNOSIS — Z6833 Body mass index (BMI) 33.0-33.9, adult: Secondary | ICD-10-CM

## 2015-07-31 DIAGNOSIS — Z96651 Presence of right artificial knee joint: Secondary | ICD-10-CM

## 2015-07-31 DIAGNOSIS — Z8249 Family history of ischemic heart disease and other diseases of the circulatory system: Secondary | ICD-10-CM

## 2015-07-31 DIAGNOSIS — Z91013 Allergy to seafood: Secondary | ICD-10-CM | POA: Diagnosis not present

## 2015-07-31 DIAGNOSIS — I10 Essential (primary) hypertension: Secondary | ICD-10-CM | POA: Diagnosis present

## 2015-07-31 DIAGNOSIS — M659 Synovitis and tenosynovitis, unspecified: Secondary | ICD-10-CM | POA: Diagnosis present

## 2015-07-31 DIAGNOSIS — Z96659 Presence of unspecified artificial knee joint: Secondary | ICD-10-CM

## 2015-07-31 DIAGNOSIS — M25561 Pain in right knee: Secondary | ICD-10-CM | POA: Diagnosis present

## 2015-07-31 DIAGNOSIS — M1711 Unilateral primary osteoarthritis, right knee: Secondary | ICD-10-CM | POA: Diagnosis present

## 2015-07-31 DIAGNOSIS — M25761 Osteophyte, right knee: Secondary | ICD-10-CM | POA: Diagnosis present

## 2015-07-31 HISTORY — PX: TOTAL KNEE ARTHROPLASTY: SHX125

## 2015-07-31 LAB — TYPE AND SCREEN
ABO/RH(D): B POS
Antibody Screen: NEGATIVE

## 2015-07-31 SURGERY — ARTHROPLASTY, KNEE, TOTAL
Anesthesia: Spinal | Site: Knee | Laterality: Right

## 2015-07-31 MED ORDER — TRANEXAMIC ACID 1000 MG/10ML IV SOLN
1000.0000 mg | Freq: Once | INTRAVENOUS | Status: AC
  Administered 2015-07-31: 1000 mg via INTRAVENOUS
  Filled 2015-07-31: qty 10

## 2015-07-31 MED ORDER — BISACODYL 10 MG RE SUPP: 10.0000 mg | Freq: Every day | RECTAL | Status: DC | PRN

## 2015-07-31 MED ORDER — ONDANSETRON HCL 4 MG/2ML IJ SOLN: 4.0000 mg | Freq: Four times a day (QID) | INTRAMUSCULAR | Status: DC | PRN

## 2015-07-31 MED ORDER — DEXAMETHASONE SODIUM PHOSPHATE 10 MG/ML IJ SOLN
INTRAMUSCULAR | Status: AC
  Filled 2015-07-31: qty 1

## 2015-07-31 MED ORDER — PHENOL 1.4 % MT LIQD: 1.0000 | OROMUCOSAL | Status: DC | PRN

## 2015-07-31 MED ORDER — FENTANYL CITRATE (PF) 100 MCG/2ML IJ SOLN
INTRAMUSCULAR | Status: AC
  Filled 2015-07-31: qty 2

## 2015-07-31 MED ORDER — ONDANSETRON HCL 4 MG/2ML IJ SOLN
INTRAMUSCULAR | Status: AC
  Filled 2015-07-31: qty 2

## 2015-07-31 MED ORDER — METOCLOPRAMIDE HCL 10 MG PO TABS: 5.0000 mg | ORAL_TABLET | Freq: Three times a day (TID) | ORAL | Status: DC | PRN

## 2015-07-31 MED ORDER — CHLORHEXIDINE GLUCONATE 4 % EX LIQD: 60.0000 mL | Freq: Once | CUTANEOUS | Status: DC

## 2015-07-31 MED ORDER — BUPIVACAINE-EPINEPHRINE 0.25% -1:200000 IJ SOLN
INTRAMUSCULAR | Status: AC
Start: 2015-07-31 — End: 2015-07-31
  Filled 2015-07-31: qty 1

## 2015-07-31 MED ORDER — METOCLOPRAMIDE HCL 5 MG/ML IJ SOLN
INTRAMUSCULAR | Status: AC
Start: 2015-07-31 — End: 2015-07-31
  Filled 2015-07-31: qty 2

## 2015-07-31 MED ORDER — BUPIVACAINE HCL (PF) 0.75 % IJ SOLN
INTRAMUSCULAR | Status: DC | PRN
  Administered 2015-07-31: 15 mg via INTRATHECAL

## 2015-07-31 MED ORDER — HYDROMORPHONE HCL 1 MG/ML IJ SOLN
0.5000 mg | INTRAMUSCULAR | Status: DC | PRN
  Administered 2015-07-31 (×2): 1 mg via INTRAVENOUS
  Filled 2015-07-31 (×2): qty 1

## 2015-07-31 MED ORDER — FERROUS SULFATE 325 (65 FE) MG PO TABS
325.0000 mg | ORAL_TABLET | Freq: Three times a day (TID) | ORAL | Status: DC
  Administered 2015-08-01: 325 mg via ORAL
  Filled 2015-07-31 (×5): qty 1

## 2015-07-31 MED ORDER — PROPOFOL 10 MG/ML IV BOLUS
INTRAVENOUS | Status: AC
  Filled 2015-07-31: qty 20

## 2015-07-31 MED ORDER — LACTATED RINGERS IV SOLN
INTRAVENOUS | Status: DC
  Administered 2015-07-31 (×2): via INTRAVENOUS
  Administered 2015-07-31: 1000 mL via INTRAVENOUS

## 2015-07-31 MED ORDER — PROPOFOL 10 MG/ML IV BOLUS
INTRAVENOUS | Status: DC | PRN
  Administered 2015-07-31: 30 mg via INTRAVENOUS
  Administered 2015-07-31: 20 mg via INTRAVENOUS
  Administered 2015-07-31: 30 mg via INTRAVENOUS

## 2015-07-31 MED ORDER — MAGNESIUM CITRATE PO SOLN: 1.0000 | Freq: Once | ORAL | Status: DC | PRN

## 2015-07-31 MED ORDER — METOCLOPRAMIDE HCL 5 MG/ML IJ SOLN
INTRAMUSCULAR | Status: DC | PRN
  Administered 2015-07-31: 10 mg via INTRAVENOUS

## 2015-07-31 MED ORDER — SODIUM CHLORIDE 0.9 % IR SOLN
Status: DC | PRN
  Administered 2015-07-31: 1000 mL

## 2015-07-31 MED ORDER — HYDROCODONE-ACETAMINOPHEN 7.5-325 MG PO TABS
1.0000 | ORAL_TABLET | ORAL | Status: DC
  Administered 2015-07-31 – 2015-08-01 (×6): 2 via ORAL
  Filled 2015-07-31 (×2): qty 2
  Filled 2015-07-31: qty 1
  Filled 2015-07-31 (×2): qty 2
  Filled 2015-07-31: qty 1
  Filled 2015-07-31: qty 2

## 2015-07-31 MED ORDER — METHOCARBAMOL 500 MG PO TABS
500.0000 mg | ORAL_TABLET | Freq: Four times a day (QID) | ORAL | Status: DC | PRN
  Administered 2015-07-31 – 2015-08-01 (×3): 500 mg via ORAL
  Filled 2015-07-31 (×4): qty 1

## 2015-07-31 MED ORDER — MENTHOL 3 MG MT LOZG
1.0000 | LOZENGE | OROMUCOSAL | Status: DC | PRN
Start: 2015-07-31 — End: 2015-08-01

## 2015-07-31 MED ORDER — MIDAZOLAM HCL 5 MG/5ML IJ SOLN
INTRAMUSCULAR | Status: DC | PRN
  Administered 2015-07-31 (×2): 1 mg via INTRAVENOUS

## 2015-07-31 MED ORDER — DOCUSATE SODIUM 100 MG PO CAPS
100.0000 mg | ORAL_CAPSULE | Freq: Two times a day (BID) | ORAL | Status: DC
  Administered 2015-07-31 – 2015-08-01 (×2): 100 mg via ORAL
  Filled 2015-07-31 (×4): qty 1

## 2015-07-31 MED ORDER — METOCLOPRAMIDE HCL 5 MG/ML IJ SOLN
INTRAMUSCULAR | Status: AC
  Filled 2015-07-31: qty 2

## 2015-07-31 MED ORDER — DEXAMETHASONE SODIUM PHOSPHATE 10 MG/ML IJ SOLN: 10.0000 mg | Freq: Once | INTRAMUSCULAR | Status: DC

## 2015-07-31 MED ORDER — KETOROLAC TROMETHAMINE 30 MG/ML IJ SOLN
INTRAMUSCULAR | Status: AC
  Filled 2015-07-31: qty 1

## 2015-07-31 MED ORDER — DIPHENHYDRAMINE HCL 25 MG PO CAPS: 25.0000 mg | ORAL_CAPSULE | Freq: Four times a day (QID) | ORAL | Status: DC | PRN

## 2015-07-31 MED ORDER — LIDOCAINE HCL (CARDIAC) 20 MG/ML IV SOLN
INTRAVENOUS | Status: AC
  Filled 2015-07-31: qty 5

## 2015-07-31 MED ORDER — CEFAZOLIN SODIUM-DEXTROSE 2-4 GM/100ML-% IV SOLN
2.0000 g | Freq: Four times a day (QID) | INTRAVENOUS | Status: AC
  Administered 2015-07-31 (×2): 2 g via INTRAVENOUS
  Filled 2015-07-31 (×2): qty 100

## 2015-07-31 MED ORDER — CEFAZOLIN SODIUM-DEXTROSE 2-4 GM/100ML-% IV SOLN
2.0000 g | INTRAVENOUS | Status: AC
  Administered 2015-07-31: 2 g via INTRAVENOUS

## 2015-07-31 MED ORDER — PHENYLEPHRINE HCL 10 MG/ML IJ SOLN
INTRAMUSCULAR | Status: DC | PRN
  Administered 2015-07-31: 40 ug via INTRAVENOUS
  Administered 2015-07-31: 80 ug via INTRAVENOUS
  Administered 2015-07-31: 40 ug via INTRAVENOUS
  Administered 2015-07-31 (×3): 80 ug via INTRAVENOUS
  Administered 2015-07-31: 40 ug via INTRAVENOUS
  Administered 2015-07-31 (×2): 80 ug via INTRAVENOUS

## 2015-07-31 MED ORDER — BUPIVACAINE-EPINEPHRINE (PF) 0.25% -1:200000 IJ SOLN
INTRAMUSCULAR | Status: DC | PRN
  Administered 2015-07-31: 30 mL

## 2015-07-31 MED ORDER — MIDAZOLAM HCL 2 MG/2ML IJ SOLN
INTRAMUSCULAR | Status: AC
  Filled 2015-07-31: qty 2

## 2015-07-31 MED ORDER — ALUM & MAG HYDROXIDE-SIMETH 200-200-20 MG/5ML PO SUSP: 30.0000 mL | ORAL | Status: DC | PRN

## 2015-07-31 MED ORDER — ASPIRIN EC 325 MG PO TBEC
325.0000 mg | DELAYED_RELEASE_TABLET | Freq: Two times a day (BID) | ORAL | Status: DC
  Administered 2015-08-01: 325 mg via ORAL
  Filled 2015-07-31 (×3): qty 1

## 2015-07-31 MED ORDER — SODIUM CHLORIDE 0.9 % IJ SOLN
INTRAMUSCULAR | Status: AC
  Filled 2015-07-31: qty 50

## 2015-07-31 MED ORDER — PHENYLEPHRINE 40 MCG/ML (10ML) SYRINGE FOR IV PUSH (FOR BLOOD PRESSURE SUPPORT)
PREFILLED_SYRINGE | INTRAVENOUS | Status: AC
  Filled 2015-07-31: qty 10

## 2015-07-31 MED ORDER — PROPOFOL 10 MG/ML IV BOLUS
INTRAVENOUS | Status: AC
  Filled 2015-07-31: qty 40

## 2015-07-31 MED ORDER — DEXTROSE 5 % IV SOLN
500.0000 mg | Freq: Four times a day (QID) | INTRAVENOUS | Status: DC | PRN
  Administered 2015-07-31: 500 mg via INTRAVENOUS
  Filled 2015-07-31: qty 550
  Filled 2015-07-31: qty 5

## 2015-07-31 MED ORDER — HYDROMORPHONE HCL 1 MG/ML IJ SOLN: 0.2500 mg | INTRAMUSCULAR | Status: DC | PRN

## 2015-07-31 MED ORDER — ONDANSETRON HCL 4 MG/2ML IJ SOLN
INTRAMUSCULAR | Status: DC | PRN
  Administered 2015-07-31: 4 mg via INTRAVENOUS

## 2015-07-31 MED ORDER — METOCLOPRAMIDE HCL 5 MG/ML IJ SOLN: 5.0000 mg | Freq: Three times a day (TID) | INTRAMUSCULAR | Status: DC | PRN

## 2015-07-31 MED ORDER — CELECOXIB 200 MG PO CAPS
200.0000 mg | ORAL_CAPSULE | Freq: Two times a day (BID) | ORAL | Status: DC
  Administered 2015-07-31 – 2015-08-01 (×2): 200 mg via ORAL
  Filled 2015-07-31 (×3): qty 1

## 2015-07-31 MED ORDER — LIDOCAINE HCL (CARDIAC) 20 MG/ML IV SOLN
INTRAVENOUS | Status: AC
Start: 2015-07-31 — End: 2015-07-31
  Filled 2015-07-31: qty 5

## 2015-07-31 MED ORDER — ONDANSETRON HCL 4 MG PO TABS: 4.0000 mg | ORAL_TABLET | Freq: Four times a day (QID) | ORAL | Status: DC | PRN

## 2015-07-31 MED ORDER — LIDOCAINE HCL (CARDIAC) 20 MG/ML IV SOLN
INTRAVENOUS | Status: DC | PRN
  Administered 2015-07-31: 50 mg via INTRAVENOUS

## 2015-07-31 MED ORDER — 0.9 % SODIUM CHLORIDE (POUR BTL) OPTIME
TOPICAL | Status: DC | PRN
  Administered 2015-07-31: 1000 mL

## 2015-07-31 MED ORDER — SODIUM CHLORIDE 0.9 % IV SOLN
INTRAVENOUS | Status: DC
  Administered 2015-07-31 – 2015-08-01 (×2): via INTRAVENOUS
  Filled 2015-07-31 (×4): qty 1000

## 2015-07-31 MED ORDER — LACTATED RINGERS IV SOLN: INTRAVENOUS | Status: DC

## 2015-07-31 MED ORDER — PROPOFOL 500 MG/50ML IV EMUL
INTRAVENOUS | Status: DC | PRN
  Administered 2015-07-31: 50 ug/kg/min via INTRAVENOUS

## 2015-07-31 MED ORDER — SODIUM CHLORIDE 0.9 % IJ SOLN
INTRAMUSCULAR | Status: DC | PRN
  Administered 2015-07-31: 30 mL

## 2015-07-31 MED ORDER — DEXAMETHASONE SODIUM PHOSPHATE 10 MG/ML IJ SOLN
10.0000 mg | Freq: Once | INTRAMUSCULAR | Status: AC
  Administered 2015-08-01: 10 mg via INTRAVENOUS
  Filled 2015-07-31: qty 1

## 2015-07-31 MED ORDER — POLYETHYLENE GLYCOL 3350 17 G PO PACK
17.0000 g | PACK | Freq: Two times a day (BID) | ORAL | Status: DC
  Filled 2015-07-31 (×3): qty 1

## 2015-07-31 MED ORDER — DEXAMETHASONE SODIUM PHOSPHATE 10 MG/ML IJ SOLN
INTRAMUSCULAR | Status: DC | PRN
  Administered 2015-07-31: 10 mg via INTRAVENOUS

## 2015-07-31 MED ORDER — CEFAZOLIN SODIUM-DEXTROSE 2-4 GM/100ML-% IV SOLN
INTRAVENOUS | Status: AC
  Filled 2015-07-31: qty 100

## 2015-07-31 MED ORDER — KETOROLAC TROMETHAMINE 30 MG/ML IJ SOLN
INTRAMUSCULAR | Status: DC | PRN
  Administered 2015-07-31: 30 mg

## 2015-07-31 MED ORDER — PHENYLEPHRINE HCL 10 MG/ML IJ SOLN
INTRAMUSCULAR | Status: AC
  Filled 2015-07-31: qty 1

## 2015-07-31 SURGICAL SUPPLY — 45 items
BAG SPEC THK2 15X12 ZIP CLS (MISCELLANEOUS) ×1
BAG ZIPLOCK 12X15 (MISCELLANEOUS) ×2 IMPLANT
BANDAGE ACE 6X5 VEL STRL LF (GAUZE/BANDAGES/DRESSINGS) ×3 IMPLANT
BLADE SAW SGTL 13.0X1.19X90.0M (BLADE) ×3 IMPLANT
BOWL SMART MIX CTS (DISPOSABLE) ×3 IMPLANT
CAPT KNEE TOTAL 3 ATTUNE ×2 IMPLANT
CEMENT HV SMART SET (Cement) ×4 IMPLANT
CLOTH BEACON ORANGE TIMEOUT ST (SAFETY) ×3 IMPLANT
CUFF TOURN SGL QUICK 34 (TOURNIQUET CUFF) ×3
CUFF TRNQT CYL 34X4X40X1 (TOURNIQUET CUFF) ×1 IMPLANT
DECANTER SPIKE VIAL GLASS SM (MISCELLANEOUS) ×3 IMPLANT
DRAPE U-SHAPE 47X51 STRL (DRAPES) ×3 IMPLANT
DRESSING AQUACEL AG SP 3.5X10 (GAUZE/BANDAGES/DRESSINGS) ×1 IMPLANT
DRSG AQUACEL AG ADV 3.5X10 (GAUZE/BANDAGES/DRESSINGS) ×2 IMPLANT
DRSG AQUACEL AG SP 3.5X10 (GAUZE/BANDAGES/DRESSINGS) ×3
DURAPREP 26ML APPLICATOR (WOUND CARE) ×6 IMPLANT
ELECT REM PT RETURN 9FT ADLT (ELECTROSURGICAL) ×3
ELECTRODE REM PT RTRN 9FT ADLT (ELECTROSURGICAL) ×1 IMPLANT
GLOVE BIOGEL PI IND STRL 7.5 (GLOVE) ×1 IMPLANT
GLOVE BIOGEL PI IND STRL 8 (GLOVE) IMPLANT
GLOVE BIOGEL PI IND STRL 8.5 (GLOVE) ×1 IMPLANT
GLOVE BIOGEL PI INDICATOR 7.5 (GLOVE) ×4
GLOVE BIOGEL PI INDICATOR 8 (GLOVE) ×2
GLOVE BIOGEL PI INDICATOR 8.5 (GLOVE) ×4
GLOVE ECLIPSE 8.0 STRL XLNG CF (GLOVE) ×3 IMPLANT
GLOVE ORTHO TXT STRL SZ7.5 (GLOVE) ×6 IMPLANT
GLOVE SURG SS PI 8.0 STRL IVOR (GLOVE) ×2 IMPLANT
GOWN STRL REUS W/TWL LRG LVL3 (GOWN DISPOSABLE) ×3 IMPLANT
GOWN STRL REUS W/TWL XL LVL3 (GOWN DISPOSABLE) ×3 IMPLANT
HANDPIECE INTERPULSE COAX TIP (DISPOSABLE) ×3
LIQUID BAND (GAUZE/BANDAGES/DRESSINGS) ×3 IMPLANT
MANIFOLD NEPTUNE II (INSTRUMENTS) ×3 IMPLANT
PACK TOTAL KNEE CUSTOM (KITS) ×3 IMPLANT
POSITIONER SURGICAL ARM (MISCELLANEOUS) ×3 IMPLANT
SET HNDPC FAN SPRY TIP SCT (DISPOSABLE) ×1 IMPLANT
SET PAD KNEE POSITIONER (MISCELLANEOUS) ×3 IMPLANT
SUT MNCRL AB 4-0 PS2 18 (SUTURE) ×3 IMPLANT
SUT VIC AB 1 CT1 36 (SUTURE) ×3 IMPLANT
SUT VIC AB 2-0 CT1 27 (SUTURE) ×9
SUT VIC AB 2-0 CT1 TAPERPNT 27 (SUTURE) ×3 IMPLANT
SUT VLOC 180 0 24IN GS25 (SUTURE) ×3 IMPLANT
SYR 50ML LL SCALE MARK (SYRINGE) ×3 IMPLANT
TRAY FOLEY W/METER SILVER 14FR (SET/KITS/TRAYS/PACK) ×3 IMPLANT
WRAP KNEE MAXI GEL POST OP (GAUZE/BANDAGES/DRESSINGS) ×3 IMPLANT
YANKAUER SUCT BULB TIP 10FT TU (MISCELLANEOUS) ×3 IMPLANT

## 2015-07-31 NOTE — Anesthesia Procedure Notes (Signed)
Spinal Patient location during procedure: OR Start time: 07/31/2015 10:00 AM End time: 07/31/2015 10:05 AM Staffing Performed by: anesthesiologist  Preanesthetic Checklist Completed: patient identified, site marked, surgical consent, pre-op evaluation, timeout performed, IV checked, risks and benefits discussed and monitors and equipment checked Spinal Block Patient position: sitting Prep: Betadine Patient monitoring: heart rate, continuous pulse ox and blood pressure Approach: midline Location: L3-4 Injection technique: single-shot Needle Needle type: Pencan  Needle gauge: 24 G Needle length: 9 cm Assessment Sensory level: T6 Additional Notes Expiration date of kit checked and confirmed. Patient tolerated procedure well, without complications.

## 2015-07-31 NOTE — Anesthesia Preprocedure Evaluation (Signed)
Anesthesia Evaluation  Patient identified by MRN, date of birth, ID band Patient awake    Reviewed: Allergy & Precautions, H&P , NPO status , Patient's Chart, lab work & pertinent test results  Airway Mallampati: II  TM Distance: >3 FB Neck ROM: full    Dental no notable dental hx. (+) Dental Advisory Given, Teeth Intact   Pulmonary neg pulmonary ROS,    Pulmonary exam normal breath sounds clear to auscultation       Cardiovascular Exercise Tolerance: Good negative cardio ROS Normal cardiovascular exam Rhythm:regular Rate:Normal     Neuro/Psych negative neurological ROS  negative psych ROS   GI/Hepatic negative GI ROS, Neg liver ROS,   Endo/Other  negative endocrine ROS  Renal/GU negative Renal ROS  negative genitourinary   Musculoskeletal   Abdominal   Peds  Hematology negative hematology ROS (+)   Anesthesia Other Findings   Reproductive/Obstetrics negative OB ROS                             Anesthesia Physical Anesthesia Plan  ASA: II  Anesthesia Plan: Spinal   Post-op Pain Management:    Induction:   Airway Management Planned:   Additional Equipment:   Intra-op Plan:   Post-operative Plan:   Informed Consent: I have reviewed the patients History and Physical, chart, labs and discussed the procedure including the risks, benefits and alternatives for the proposed anesthesia with the patient or authorized representative who has indicated his/her understanding and acceptance.   Dental Advisory Given  Plan Discussed with: CRNA  Anesthesia Plan Comments:         Anesthesia Quick Evaluation

## 2015-07-31 NOTE — Interval H&P Note (Signed)
History and Physical Interval Note:  07/31/2015 8:52 AM  Brittany LarsenBarbara Roberson  has presented today for surgery, with the diagnosis of RIGHT KNEE OA  The various methods of treatment have been discussed with the patient and family. After consideration of risks, benefits and other options for treatment, the patient has consented to  Procedure(s): RIGHT TOTAL KNEE ARTHROPLASTY (Right) as a surgical intervention .  The patient's history has been reviewed, patient examined, no change in status, stable for surgery.  I have reviewed the patient's chart and labs.  Questions were answered to the patient's satisfaction.     Shelda PalLIN,Jb Dulworth D

## 2015-07-31 NOTE — Op Note (Signed)
NAME:  Brittany Roberson                      MEDICAL RECORD NO.:  [id number]                             FACILITY:  [facility]      PHYSICIAN:  Madlyn Frankel. Charlann Boxer, M.D.  DATE OF BIRTH:  06/26/1960      DATE OF PROCEDURE:  07/31/2015                                     OPERATIVE REPORT         PREOPERATIVE DIAGNOSIS:  Right knee osteoarthritis.      POSTOPERATIVE DIAGNOSIS:  Right knee osteoarthritis.      FINDINGS:  The patient was noted to have complete loss of cartilage and   bone-on-bone arthritis with associated osteophytes in the medial and patellofemoral compartments of   the knee with a significant synovitis and associated effusion.      PROCEDURE:  Right total knee replacement.      COMPONENTS USED:  DePuy Attune rotating platform posterior stabilized knee   system, a size 4N femur, 3 tibia, size 6 mm PS AOX insert, and 32 anatomic patellar   button.      SURGEON:  Madlyn Frankel. Charlann Boxer, M.D.      ASSISTANT:  Lanney Gins, PA-C.      ANESTHESIA:  Spinal.      SPECIMENS:  None.      COMPLICATION:  None.      DRAINS:  None  EBL: <50cc      TOURNIQUET TIME:   Total Tourniquet Time Documented: Thigh (Right) - 28 minutes Total: Thigh (Right) - 28 minutes  .      The patient was stable to the recovery room.      INDICATION FOR PROCEDURE:  Brittany Roberson is a 55 y.o. female patient of   mine.  The patient had been seen, evaluated, and treated conservatively in the   office with medication, activity modification, and injections.  The patient had   radiographic changes of bone-on-bone arthritis with endplate sclerosis and osteophytes noted.      The patient failed conservative measures including medication, injections, and activity modification, and at this point was ready for more definitive measures.   Based on the radiographic changes and failed conservative measures, the patient   decided to proceed with total knee replacement.  Risks of infection,   DVT, component  failure, need for revision surgery, postop course, and   expectations were all   discussed and reviewed.  Consent was obtained for benefit of pain   relief.      PROCEDURE IN DETAIL:  The patient was brought to the operative theater.   Once adequate anesthesia, preoperative antibiotics, 2 gm of Ancef, 1 gm of Tranexamic Acid, and 10 mg of Decadron administered, the patient was positioned supine with the right thigh tourniquet placed.  The  right lower extremity was prepped and draped in sterile fashion.  A time-   out was performed identifying the patient, planned procedure, and   extremity.      The right lower extremity was placed in the [facility] leg holder.  The leg was   exsanguinated, tourniquet elevated to 250 mmHg.  A midline incision was   made followed by  median parapatellar arthrotomy.  Following initial   exposure, attention was first directed to the patella.  Precut   measurement was noted to be 21 mm.  I resected down to 13-14 mm and used a   32 patellar button to restore patellar height as well as cover the cut   surface.      The lug holes were drilled and a metal shim was placed to protect the   patella from retractors and saw blades.      At this point, attention was now directed to the femur.  The femoral   canal was opened with a drill, irrigated to try to prevent fat emboli.  An   intramedullary rod was passed at 3 degrees valgus, 8 mm of bone was   resected off the distal femur due to pre-operative hyperextension.  Following this resection, the tibia was   subluxated anteriorly.  Using the extramedullary guide, 2 mm of bone was resected off   the proximal medial tibia.  We confirmed the gap would be   stable medially and laterally with a size 5 mm insert as well as confirmed   the cut was perpendicular in the coronal plane, checking with an alignment rod.      Once this was done, I sized the femur to be a size 4 in the anterior-   posterior dimension, chose a narrow  component based on medial and   lateral dimension.  The size 4 rotation block was then pinned in   position anterior referenced using the C-clamp to set rotation.  The   anterior, posterior, and  chamfer cuts were made without difficulty nor   notching making certain that I was along the anterior cortex to help   with flexion gap stability.      The final box cut was made off the lateral aspect of distal femur.      At this point, the tibia was sized to be a size 3, the size 3 tray was   then pinned in position through the medial third of the tubercle,   drilled, and keel punched.  Trial reduction was now carried with a 4 femur,  3 tibia, a size 6 mm insert, and the 32 anatomic patella botton.  The knee was brought to   extension, full extension with good flexion stability with the patella   tracking through the trochlea without application of pressure.  Given   all these findings the femoral lug holes were drilled and then the trial components removed.  Final components were   opened and cement was mixed.  The knee was irrigated with normal saline   solution and pulse lavage.  The synovial lining was   then injected with 30 cc of 0.25% Marcaine with epinephrine and 1 cc of Toradol plus 30 cc of NS for a  total of 61 cc.      The knee was irrigated.  Final implants were then cemented onto clean and   dried cut surfaces of bone with the knee brought to extension with a size 6 mm trial insert.      Once the cement had fully cured, the excess cement was removed   throughout the knee.  I confirmed I was satisfied with the range of   motion and stability, and the final size 6 mm PS AOX insert was chosen.  It was   placed into the knee.      The tourniquet had been let down at 28  minutes.  No significant   hemostasis required.  The   extensor mechanism was then reapproximated using #1 Vicryl and #0 V-lock sutures with the knee   in flexion.  The   remaining wound was closed with 2-0 Vicryl  and running 4-0 Monocryl.   The knee was cleaned, dried, dressed sterilely using Dermabond and   Aquacel dressing.  The patient was then   brought to recovery room in stable condition, tolerating the procedure   well.   Please note that Physician Assistant, Lanney Gins, PA-C, was present for the entirety of the case, and was utilized for pre-operative positioning, peri-operative retractor management, general facilitation of the procedure.  He was also utilized for primary wound closure at the end of the case.           Madlyn Frankel Charlann Boxer, M.D.    07/31/2015 11:26 AM

## 2015-07-31 NOTE — Transfer of Care (Signed)
Immediate Anesthesia Transfer of Care Note  Patient: Brittany LarsenBarbara Chandler  Procedure(s) Performed: Procedure(s): RIGHT TOTAL KNEE ARTHROPLASTY (Right)  Patient Location: PACU  Anesthesia Type:Spinal  Level of Consciousness: awake, alert , oriented and patient cooperative  Airway & Oxygen Therapy: Patient Spontanous Breathing and Patient connected to face mask oxygen  Post-op Assessment: Report given to RN and Post -op Vital signs reviewed and stable  Post vital signs: Reviewed and stable  Last Vitals:  Filed Vitals:   07/31/15 0700  BP: 140/92  Pulse: 65  Temp: 36.3 C  Resp: 16    Last Pain: There were no vitals filed for this visit.    Patients Stated Pain Goal: 3 (07/31/15 0911)  Complications: No apparent anesthesia complications

## 2015-07-31 NOTE — Anesthesia Postprocedure Evaluation (Signed)
Anesthesia Post Note  Patient: Brittany LarsenBarbara Kontos  Procedure(s) Performed: Procedure(s) (LRB): RIGHT TOTAL KNEE ARTHROPLASTY (Right)  Patient location during evaluation: PACU Anesthesia Type: Spinal Level of consciousness: awake and alert Pain management: pain level controlled Vital Signs Assessment: post-procedure vital signs reviewed and stable Respiratory status: spontaneous breathing, nonlabored ventilation, respiratory function stable and patient connected to nasal cannula oxygen Cardiovascular status: blood pressure returned to baseline and stable Postop Assessment: no signs of nausea or vomiting Anesthetic complications: no    Last Vitals:  Filed Vitals:   07/31/15 1245 07/31/15 1300  BP: 121/69 121/64  Pulse: 58 59  Temp: 36.4 C   Resp: 15 20    Last Pain: There were no vitals filed for this visit.               Keyonia Gluth L

## 2015-07-31 NOTE — Discharge Instructions (Signed)

## 2015-07-31 NOTE — Evaluation (Signed)
Physical Therapy Evaluation Patient Details Name: Brittany LarsenBarbara Roberson MRN: [id number] DOB: 1960-04-11 Today's Date: 07/31/2015   History of Present Illness  55 yo female s/p R TKA 07/31/15  Clinical Impression  On eval POD 0, pt required Min assist for mobility-walked ~40 feet with RW. Pain rated ~3/10 with activity. Will follow and progress activity as tolerated.     Follow Up Recommendations Home health PT    Equipment Recommendations  None recommended by PT    Recommendations for Other Services       Precautions / Restrictions Precautions Precautions: None Restrictions Weight Bearing Restrictions: No RLE Weight Bearing: Weight bearing as tolerated      Mobility  Bed Mobility Overal bed mobility: Needs Assistance Bed Mobility: Supine to Sit     Supine to sit: Min assist     General bed mobility comments: assist for R LE  Transfers Overall transfer level: Needs assistance Equipment used: Rolling walker (2 wheeled) Transfers: Sit to/from Stand Sit to Stand: Min guard         General transfer comment: close guard for safety. VCs safety, hand placement  Ambulation/Gait Ambulation/Gait assistance: Min guard Ambulation Distance (Feet): 40 Feet Assistive device: Rolling walker (2 wheeled) Gait Pattern/deviations: Step-to pattern;Decreased stride length     General Gait Details: close guard for safety. VCs sequence. Pt c/o mild lightheadedness  Stairs            Wheelchair Mobility    Modified Rankin (Stroke Patients Only)       Balance                                             Pertinent Vitals/Pain Pain Assessment: 0-10 Pain Score: 3  Pain Location: R knee Pain Descriptors / Indicators: Sore;Aching Pain Intervention(s): Monitored during session;Ice applied;Repositioned    Home Living Family/patient expects to be discharged to:: Private residence Living Arrangements: Spouse/significant other (Parents also staying for a  couple of weeks)   Type of Home: House Home Access: Stairs to enter   Secretary/administratorntrance Stairs-Number of Steps: 1 Home Layout: Able to live on main level with bedroom/bathroom Home Equipment: Walker - 2 wheels;Bedside commode      Prior Function Level of Independence: Independent               Hand Dominance        Extremity/Trunk Assessment   Upper Extremity Assessment: Defer to OT evaluation           Lower Extremity Assessment: RLE deficits/detail RLE Deficits / Details: moves ankle well    Cervical / Trunk Assessment: Normal  Communication   Communication: No difficulties  Cognition Arousal/Alertness: Awake/alert Behavior During Therapy: WFL for tasks assessed/performed Overall Cognitive Status: Within Functional Limits for tasks assessed                      General Comments      Exercises        Assessment/Plan    PT Assessment Patient needs continued PT services  PT Diagnosis Difficulty walking;Acute pain   PT Problem List Decreased strength;Decreased range of motion;Decreased activity tolerance;Decreased balance;Decreased mobility;Decreased knowledge of use of DME;Pain  PT Treatment Interventions DME instruction;Gait training;Stair training;Functional mobility training;Therapeutic activities;Patient/family education;Balance training;Therapeutic exercise   PT Goals (Current goals can be found in the Care Plan section) Acute Rehab PT Goals Patient Stated Goal: home  tomorrow PT Goal Formulation: With patient/family Time For Goal Achievement: 08/07/15 Potential to Achieve Goals: Good    Frequency 7X/week   Barriers to discharge        Co-evaluation               End of Session   Activity Tolerance: Patient tolerated treatment well Patient left: in chair;with call bell/phone within reach;with chair alarm set;with family/visitor present           Time: 1610-9604 PT Time Calculation (min) (ACUTE ONLY): 17 min   Charges:   PT  Evaluation $PT Eval Low Complexity: 1 Procedure     PT G Codes:        Rebeca Alert, MPT Pager: [phone]

## 2015-08-01 DIAGNOSIS — E669 Obesity, unspecified: Secondary | ICD-10-CM | POA: Diagnosis present

## 2015-08-01 LAB — BASIC METABOLIC PANEL
Anion gap: 5 (ref 5–15)
BUN: 9 mg/dL (ref 6–20)
CHLORIDE: 109 mmol/L (ref 101–111)
CO2: 25 mmol/L (ref 22–32)
Calcium: 8.6 mg/dL — ABNORMAL LOW (ref 8.9–10.3)
Creatinine, Ser: 0.69 mg/dL (ref 0.44–1.00)
GFR calc non Af Amer: >60 mL/min (ref >60)
Glucose, Bld: 150 mg/dL — ABNORMAL HIGH (ref 65–99)
POTASSIUM: 4.6 mmol/L (ref 3.5–5.1)
SODIUM: 139 mmol/L (ref 135–145)

## 2015-08-01 LAB — CBC
HCT: 37.9 % (ref 36.0–46.0)
HEMOGLOBIN: 14 g/dL (ref 12.0–15.0)
MCH: 31.8 pg (ref 26.0–34.0)
MCHC: 36.9 g/dL — ABNORMAL HIGH (ref 30.0–36.0)
MCV: 86.1 fL (ref 78.0–100.0)
Platelets: 237 10*3/uL (ref 150–400)
RBC: 4.4 MIL/uL (ref 3.87–5.11)
RDW: 13 % (ref 11.5–15.5)
WBC: 18.8 10*3/uL — ABNORMAL HIGH (ref 4.0–10.5)

## 2015-08-01 MED ORDER — CELECOXIB 200 MG PO CAPS: 200.0000 mg | ORAL_CAPSULE | Freq: Two times a day (BID) | ORAL | Status: DC

## 2015-08-01 MED ORDER — DOCUSATE SODIUM 100 MG PO CAPS: 100.0000 mg | ORAL_CAPSULE | Freq: Two times a day (BID) | ORAL | Status: DC

## 2015-08-01 MED ORDER — POLYETHYLENE GLYCOL 3350 17 G PO PACK: 17.0000 g | PACK | Freq: Two times a day (BID) | ORAL | Status: DC

## 2015-08-01 MED ORDER — HYDROCODONE-ACETAMINOPHEN 7.5-325 MG PO TABS: 1.0000 | ORAL_TABLET | ORAL | Status: DC | PRN

## 2015-08-01 MED ORDER — METHOCARBAMOL 500 MG PO TABS: 500.0000 mg | ORAL_TABLET | Freq: Four times a day (QID) | ORAL | Status: DC | PRN

## 2015-08-01 MED ORDER — FERROUS SULFATE 325 (65 FE) MG PO TABS: 325.0000 mg | ORAL_TABLET | Freq: Three times a day (TID) | ORAL | Status: AC

## 2015-08-01 MED ORDER — ASPIRIN 325 MG PO TBEC: 325.0000 mg | DELAYED_RELEASE_TABLET | Freq: Two times a day (BID) | ORAL | Status: AC

## 2015-08-01 NOTE — Care Management Note (Addendum)
Case Management Note  Patient Details  Name: Brittany Roberson MRN: [id number] Date of Birth: [birth year]  Subjective/Objective:                  RIGHT TOTAL KNEE ARTHROPLASTY (Right)  Action/Plan: Discharge planning Expected Discharge Date:  08/01/15              Expected Discharge Plan:  [place]  In-House Referral:     Discharge planning Services  CM Consult  Post Acute Care Choice:    Choice offered to:  Patient  DME Arranged:  N/A DME Agency:  NA  HH Arranged:  PT HH Agency:  [facility]  Status of Service:  Completed, signed off  Medicare Important Message Given:    Date Medicare IM Given:    Medicare IM give by:    Date Additional Medicare IM Given:    Additional Medicare Important Message give by:     If discussed at [place] of Stay Meetings, dates discussed:    Additional Comments: CM met with pt in room to offer choice of home health agency.  Pt chooses [facility] to render HHPT.  Referral given to [facility] rep, Tim (on unit).  Pt states she has all DME at home and declines need for additional DME.  No other CM needs were communicated. Dellie Catholic, RN 08/01/2015, 1:16 PM

## 2015-08-01 NOTE — Progress Notes (Signed)
     Subjective: 1 Day Post-Op Procedure(s) (LRB): RIGHT TOTAL KNEE ARTHROPLASTY (Right)   Seen by Dr. Charlann Boxerlin. Patient reports pain as mild, pain controlled. No events throughout the night.  Ready to be discharged home if she continues to do well with PT.   Objective:   VITALS:   Filed Vitals:   08/01/15 0138 08/01/15 0530  BP: 133/64 128/59  Pulse: 60 75  Temp: 98.1 F (36.7 C) 98.5 F (36.9 C)  Resp: 16 16    Dorsiflexion/Plantar flexion intact Incision: dressing C/D/I No cellulitis present Compartment soft  LABS  Recent Labs  08/01/15 0430  HGB 14.0  HCT 37.9  WBC 18.8*  PLT 237     Recent Labs  08/01/15 0430  NA 139  K 4.6  BUN 9  CREATININE 0.69  GLUCOSE 150*     Assessment/Plan: 1 Day Post-Op Procedure(s) (LRB): RIGHT TOTAL KNEE ARTHROPLASTY (Right) Foley cath d/c'ed Advance diet Up with therapy D/C IV fluids Discharge home with home health [facility]. Follow up with OLIN,Shaneta Cervenka D in 2 weeks.  Contact information:  [facility] [address] [place] [id number] [phone]       Obese (BMI 30-39.9) Estimated body mass index is 33.49 kg/(m^2) as calculated from the following:   Height as of this encounter: 5\' 3"  (1.6 m).   Weight as of this encounter: 85.73 kg (189 lb). Patient also counseled that weight may inhibit the healing process Patient counseled that losing weight will help with future health issues        Anastasio AuerbachMatthew S. Illa Enlow   PAC  08/01/2015, 8:01 AM

## 2015-08-01 NOTE — Progress Notes (Signed)
Occupational Therapy Evaluation Patient Details Name: Brittany Roberson MRN: [id number] DOB: 07-Feb-1961 Today's Date: 08/01/2015    History of Present Illness 55 yo female s/p R TKA 07/31/15   Clinical Impression   All OT education completed and pt questions answered. No further OT needs; will sign off.    Follow Up Recommendations  No OT follow up;Supervision - Intermittent    Equipment Recommendations  None recommended by OT    Recommendations for Other Services       Precautions / Restrictions Precautions Precautions: None Restrictions Weight Bearing Restrictions: No RLE Weight Bearing: Weight bearing as tolerated      Mobility Bed Mobility            General bed mobility comments: NT -- up in chair  Transfers Overall transfer level: Needs assistance Equipment used: Rolling walker (2 wheeled) Transfers: Sit to/from Stand Sit to Stand: Supervision             Balance                                            ADL Overall ADL's : Needs assistance/impaired Eating/Feeding: Independent;Sitting   Grooming: Supervision/safety;Standing           Upper Body Dressing : Set up;Sitting   Lower Body Dressing: Supervision/safety;Sit to/from stand   Toilet Transfer: Supervision/safety;Ambulation;BSC;Regular Toilet;RW   Toileting- Clothing Manipulation and Hygiene: Supervision/safety;Sit to/from stand   Tub/ Shower Transfer: Walk-in shower;Supervision/safety;Ambulation;Rolling walker   Functional mobility during ADLs: Supervision/safety;Rolling walker General ADL Comments: Patient and husband asking about where to install a grab bar in the shower; we discussed potential locations.     Vision     Perception     Praxis      Pertinent Vitals/Pain Pain Assessment: 0-10 Pain Score: 2  Pain Location: R knee Pain Descriptors / Indicators: Aching;Sore Pain Intervention(s): Monitored during session;Repositioned     Hand  Dominance     Extremity/Trunk Assessment Upper Extremity Assessment Upper Extremity Assessment: Overall WFL for tasks assessed   Lower Extremity Assessment Lower Extremity Assessment: Defer to PT evaluation       Communication Communication Communication: No difficulties   Cognition Arousal/Alertness: Awake/alert Behavior During Therapy: WFL for tasks assessed/performed Overall Cognitive Status: Within Functional Limits for tasks assessed                     General Comments       Exercises       Shoulder Instructions      Home Living Family/patient expects to be discharged to:: Private residence Living Arrangements: Spouse/significant other   Type of Home: House Home Access: Stairs to enter Secretary/administrator of Steps: 1   Home Layout: Able to live on main level with bedroom/bathroom     Bathroom Shower/Tub: Producer, television/film/video: Standard Bathroom Accessibility: Yes How Accessible: Accessible via walker Home Equipment: Walker - 2 wheels;Bedside commode;Shower seat          Prior Functioning/Environment Level of Independence: Independent             OT Diagnosis: Acute pain   OT Problem List: Decreased strength;Decreased range of motion;Decreased activity tolerance;Decreased knowledge of use of DME or AE;Pain   OT Treatment/Interventions:      OT Goals(Current goals can be found in the care plan section) Acute Rehab OT Goals Patient Stated Goal: home  today OT Goal Formulation: All assessment and education complete, DC therapy  OT Frequency:     Barriers to D/C:            Co-evaluation              End of Session Equipment Utilized During Treatment: Rolling walker Nurse Communication: Mobility status  Activity Tolerance: Patient tolerated treatment well Patient left: in chair;with call bell/phone within reach;with family/visitor present   Time: 1211-1226 OT Time Calculation (min): 15 min Charges:  OT  General Charges $OT Visit: 1 Procedure OT Evaluation $OT Eval Low Complexity: 1 Procedure G-Codes:    Michela Herst A 08/01/2015, 1:07 PM

## 2015-08-01 NOTE — Progress Notes (Signed)
Physical Therapy Treatment Patient Details Name: Brittany LarsenBarbara Roberson MRN: [id number] DOB: September 13, 1960 Today's Date: 08/01/2015    History of Present Illness 55 yo female s/p R TKA 07/31/15    PT Comments    Progressing well with mobility. Will have a 2nd session to practice stair negotiation prior to d/c home  Follow Up Recommendations  Home health PT     Equipment Recommendations  None recommended by PT    Recommendations for Other Services       Precautions / Restrictions Precautions Precautions: None Restrictions Weight Bearing Restrictions: No RLE Weight Bearing: Weight bearing as tolerated    Mobility  Bed Mobility Overal bed mobility: Needs Assistance Bed Mobility: Supine to Sit     Supine to sit: Min guard     General bed mobility comments: close guard for safety  Transfers Overall transfer level: Needs assistance Equipment used: Rolling walker (2 wheeled) Transfers: Sit to/from Stand Sit to Stand: Min guard         General transfer comment: close guard for safety. VCs safety, hand placement  Ambulation/Gait Ambulation/Gait assistance: Min guard Ambulation Distance (Feet): 75 Feet Assistive device: Rolling walker (2 wheeled) Gait Pattern/deviations: Step-to pattern;Decreased stride length     General Gait Details: close guard for safety. VCs sequence.   Stairs            Wheelchair Mobility    Modified Rankin (Stroke Patients Only)       Balance                                    Cognition Arousal/Alertness: Awake/Roberson Behavior During Therapy: WFL for tasks assessed/performed Overall Cognitive Status: Within Functional Limits for tasks assessed                      Exercises Total Joint Exercises Ankle Circles/Pumps: AROM;Both;10 reps;Supine Quad Sets: AROM;Both;10 reps;Supine Hip ABduction/ADduction: Right;10 reps;Supine;AROM Straight Leg Raises: AROM;Right;10 reps;Supine Long Arc Quad: AROM;Right;10  reps;Seated Knee Flexion: AAROM;Right;10 reps;Seated Goniometric ROM: ~5-70 degrees    General Comments        Pertinent Vitals/Pain Pain Assessment: 0-10 Pain Score: 3  Pain Location: R knee Pain Descriptors / Indicators: Aching;Sore Pain Intervention(s): Monitored during session;Ice applied;Repositioned    Home Living                      Prior Function            PT Goals (current goals can now be found in the care plan section) Progress towards PT goals: Progressing toward goals    Frequency  7X/week    PT Plan Current plan remains appropriate    Co-evaluation             End of Session   Activity Tolerance: Patient tolerated treatment well Patient left: in chair;with call bell/phone within reach     Time: 0859-0919 PT Time Calculation (min) (ACUTE ONLY): 20 min  Charges:  $Gait Training: 8-22 mins                    G Codes:      Brittany AlertJannie Aodhan Roberson, MPT Pager: [phone]

## 2015-08-01 NOTE — Progress Notes (Signed)
Physical Therapy Treatment Patient Details Name: Brittany LarsenBarbara Mccahill MRN: [id number] DOB: [birth year] Today's Date: 08/01/2015    History of Present Illness [age] yo female s/p R TKA 07/31/15    PT Comments    Progressing very well with mobility. 2nd session to practice 1 step. All education completed. Ready to d/c from PT standpoint.   Follow Up Recommendations  Home health PT     Equipment Recommendations  None recommended by PT    Recommendations for Other Services       Precautions / Restrictions Precautions Precautions: None Restrictions Weight Bearing Restrictions: No RLE Weight Bearing: Weight bearing as tolerated    Mobility  Bed Mobility        General bed mobility comments: NT -- up in chair  Transfers Overall transfer level: Needs assistance Equipment used: Rolling walker (2 wheeled) Transfers: Sit to/from Stand Sit to Stand: Supervision         General transfer comment: for safety.  Ambulation/Gait Ambulation/Gait assistance: Supervision Ambulation Distance (Feet): 65 Feet Assistive device: Rolling walker (2 wheeled) Gait Pattern/deviations: Step-to pattern;Antalgic     General Gait Details: for safety.    Stairs Stairs: Yes Stairs assistance: Min guard Stair Management: Step to pattern;Forwards;With walker Number of Stairs: 1 General stair comments: close guard for safety. VCs safety, technique, sequence.   Wheelchair Mobility    Modified Rankin (Stroke Patients Only)       Balance                                    Cognition Arousal/Alertness: Awake/alert Behavior During Therapy: WFL for tasks assessed/performed Overall Cognitive Status: Within Functional Limits for tasks assessed                      Exercises     General Comments        Pertinent Vitals/Pain Pain Assessment: 0-10 Pain Score: 5  Pain Location: R knee with activity Pain Descriptors / Indicators: Aching;Sore Pain Intervention(s):  Monitored during session;Ice applied;Repositioned    Home Living Family/patient expects to be discharged to:: Private residence Living Arrangements: Spouse/significant other   Type of Home: House Home Access: Stairs to enter   Home Layout: Able to live on main level with bedroom/bathroom Home Equipment: Environmental consultantWalker - 2 wheels;Bedside commode;Shower seat      Prior Function Level of Independence: Independent          PT Goals (current goals can now be found in the care plan section) Acute Rehab PT Goals Patient Stated Goal: home today Progress towards PT goals: Progressing toward goals    Frequency  7X/week    PT Plan Current plan remains appropriate    Co-evaluation             End of Session Equipment Utilized During Treatment: Gait belt Activity Tolerance: Patient tolerated treatment well Patient left: in chair;with call bell/phone within reach;with family/visitor present     Time: 4098-11911312-1321 PT Time Calculation (min) (ACUTE ONLY): 9 min  Charges:  $Gait Training: 8-22 mins                    G Codes:      Rebeca AlertJannie Dayle Mcnerney, MPT Pager: [phone]

## 2015-08-06 NOTE — Discharge Summary (Signed)
Physician Discharge Summary  Patient ID: Brittany Roberson MRN: [id number] DOB/AGE: 57/25/1962 57 y.o.  Admit date: 07/31/2015 Discharge date: 08/01/2015   Procedures:  Procedure(s) (LRB): RIGHT TOTAL KNEE ARTHROPLASTY (Right)  Attending Physician:  Dr. Durene Romans   Admission Diagnoses:   Right knee primary OA / pain  Discharge Diagnoses:  Principal Problem:   S/P right TKA Active Problems:   Obese  Past Medical History  Diagnosis Date  . Allergy   . Arthritis     knees  . Menopause   . Hyperlipidemia   . Endometriosis   . Obesity     HPI:    Brittany Roberson, 55 y.o. female, has a history of pain and functional disability in the right knee due to arthritis and has failed non-surgical conservative treatments for greater than 12 weeks to include NSAID's and/or analgesics, corticosteriod injections, viscosupplementation injections and activity modification. Onset of symptoms was abrupt, starting 8+ years ago with gradually worsening course since that time. The patient noted prior procedures on the knee to include arthroscopy and menisectomy on the right knee(s). Patient currently rates pain in the right knee(s) at 6 out of 10 with activity. Patient has night pain, worsening of pain with activity and weight bearing, pain that interferes with activities of daily living, pain with passive range of motion, crepitus and joint swelling. Patient has evidence of periarticular osteophytes and joint space narrowing by imaging studies. There is no active infection. Risks, benefits and expectations were discussed with the patient. Risks including but not limited to the risk of anesthesia, blood clots, nerve damage, blood vessel damage, failure of the prosthesis, infection and up to and including death. Patient understand the risks, benefits and expectations and wishes to proceed with surgery.   PCP: Levon Hedger, MD   Discharged Condition: good  Hospital Course:  Patient  underwent the above stated procedure on 07/31/2015. Patient tolerated the procedure well and brought to the recovery room in good condition and subsequently to the floor.  POD #1 BP: 128/59 ; Pulse: 75 ; Temp: 98.5 F (36.9 C) ; Resp: 16 Patient reports pain as mild, pain controlled. No events throughout the night. Ready to be discharged home. Dorsiflexion/plantar flexion intact, incision: dressing C/D/I, no cellulitis present and compartment soft.   LABS  Basename    HGB     14.0  HCT     37.9    Discharge Exam: General appearance: alert, cooperative and no distress Extremities: Homans sign is negative, no sign of DVT, no edema, redness or tenderness in the calves or thighs and no ulcers, gangrene or trophic changes  Disposition: Home with follow up in 2 weeks   Follow-up Information    Follow up with Shelda Pal, MD. Schedule an appointment as soon as possible for a visit in 2 weeks.   Specialty:  Orthopedic Surgery   Contact information:   [address] [place] [address] [phone]       Follow up with [facility].   Why:  home health physical therapy   Contact information:   [address] [place] [address] [phone]       Discharge Instructions    Call MD / Call 911    Complete by:  As directed   If you experience chest pain or shortness of breath, CALL 911 and be transported to the hospital emergency room.  If you develope a fever above 101 F, pus (white drainage) or increased drainage or redness  at the wound, or calf pain, call your surgeon's office.     Change dressing    Complete by:  As directed   Maintain surgical dressing until follow up in the clinic. If the edges start to pull up, may reinforce with tape. If the dressing is no longer working, may remove and cover with gauze and tape, but must keep the area dry and clean.  Call with any questions or concerns.     Constipation Prevention    Complete by:  As  directed   Drink plenty of fluids.  Prune juice may be helpful.  You may use a stool softener, such as Colace (over the counter) 100 mg twice a day.  Use MiraLax (over the counter) for constipation as needed.     Diet - low sodium heart healthy    Complete by:  As directed      Discharge instructions    Complete by:  As directed   Maintain surgical dressing until follow up in the clinic. If the edges start to pull up, may reinforce with tape. If the dressing is no longer working, may remove and cover with gauze and tape, but must keep the area dry and clean.  [facility]. Call with any questions or concerns.     Increase activity slowly as tolerated    Complete by:  As directed   Weight bearing as tolerated with assist device (walker, cane, etc) as directed, use it as long as suggested by your surgeon or therapist, typically at least 4-6 weeks.     TED hose    Complete by:  As directed   Use stockings (TED hose) for 2 weeks on both leg(s).  You may remove them at night for sleeping.             Medication List    TAKE these medications        aspirin 325 MG EC tablet  Take 1 tablet (325 mg total) by mouth 2 (two) times daily.     celecoxib 200 MG capsule  Commonly known as:  CELEBREX  Take 1 capsule (200 mg total) by mouth every 12 (twelve) hours.     docusate sodium 100 MG capsule  Commonly known as:  COLACE  Take 1 capsule (100 mg total) by mouth 2 (two) times daily.     ferrous sulfate 325 (65 FE) MG tablet  Take 1 tablet (325 mg total) by mouth 3 (three) times daily after meals.     HYDROcodone-acetaminophen 7.5-325 MG tablet  Commonly known as:  NORCO  Take 1-2 tablets by mouth every 4 (four) hours as needed for moderate pain.     methocarbamol 500 MG tablet  Commonly known as:  ROBAXIN  Take 1 tablet (500 mg total) by mouth every 6 (six) hours as needed for muscle spasms.     polyethylene glycol packet  Commonly known as:   MIRALAX / GLYCOLAX  Take 17 g by mouth 2 (two) times daily.     VITAMIN B-12 PO  Take 1 tablet by mouth daily.     Vitamin D3 2000 units Tabs  Take 2,000 Units by mouth daily.         Signed: Anastasio AuerbachMatthew S. Avir Deruiter   PA-C  08/06/2015, 2:30 PM

## 2015-08-14 ENCOUNTER — Other Ambulatory Visit (HOSPITAL_COMMUNITY): Payer: Self-pay | Admitting: Orthopedic Surgery

## 2015-08-14 ENCOUNTER — Ambulatory Visit (HOSPITAL_COMMUNITY)
Admission: RE | Admit: 2015-08-14 | Discharge: 2015-08-14 | Disposition: A | Discharge status: Home / Self Care | Payer: BC Managed Care – PPO | Source: Ambulatory Visit | Attending: Internal Medicine | Admitting: Internal Medicine

## 2015-08-14 DIAGNOSIS — M7989 Other specified soft tissue disorders: Principal | ICD-10-CM

## 2015-08-14 DIAGNOSIS — M79661 Pain in right lower leg: Secondary | ICD-10-CM

## 2015-08-14 DIAGNOSIS — M79604 Pain in right leg: Secondary | ICD-10-CM | POA: Insufficient documentation

## 2015-08-14 NOTE — Progress Notes (Signed)
VASCULAR LAB PRELIMINARY  PRELIMINARY  PRELIMINARY  PRELIMINARY  Right lower extremity venous duplex has been completed.     Right:  No evidence of DVT, superficial thrombosis, or Baker's cyst.  Baker's cyst in pop fossa in right  Leg  Called [facility] with [facility] gave her the result , also faxed the results to Dr. Charlann Boxerlin.  Siarah Deleo, RVT, RDMS 08/14/2015, 1:44 PM

## 2016-03-12 ENCOUNTER — Other Ambulatory Visit: Payer: Self-pay | Admitting: Internal Medicine

## 2016-03-12 DIAGNOSIS — Z1231 Encounter for screening mammogram for malignant neoplasm of breast: Secondary | ICD-10-CM

## 2016-04-07 ENCOUNTER — Ambulatory Visit
Admission: RE | Admit: 2016-04-07 | Discharge: 2016-04-07 | Disposition: A | Discharge status: Home / Self Care | Payer: BC Managed Care – PPO | Source: Ambulatory Visit | Attending: Internal Medicine | Admitting: Internal Medicine

## 2016-04-07 DIAGNOSIS — Z1231 Encounter for screening mammogram for malignant neoplasm of breast: Secondary | ICD-10-CM

## 2017-03-03 ENCOUNTER — Encounter: Payer: Self-pay | Admitting: Neurology

## 2017-05-15 ENCOUNTER — Encounter (HOSPITAL_BASED_OUTPATIENT_CLINIC_OR_DEPARTMENT_OTHER): Payer: BC Managed Care – PPO

## 2017-05-23 ENCOUNTER — Ambulatory Visit (HOSPITAL_BASED_OUTPATIENT_CLINIC_OR_DEPARTMENT_OTHER): Payer: BC Managed Care – PPO | Attending: Internal Medicine | Admitting: Internal Medicine

## 2017-05-23 DIAGNOSIS — G4733 Obstructive sleep apnea (adult) (pediatric): Secondary | ICD-10-CM

## 2017-05-23 DIAGNOSIS — R0683 Snoring: Secondary | ICD-10-CM | POA: Insufficient documentation

## 2017-05-23 DIAGNOSIS — R0681 Apnea, not elsewhere classified: Secondary | ICD-10-CM | POA: Insufficient documentation

## 2017-05-23 DIAGNOSIS — G4761 Periodic limb movement disorder: Secondary | ICD-10-CM | POA: Diagnosis not present

## 2017-05-25 ENCOUNTER — Ambulatory Visit (INDEPENDENT_AMBULATORY_CARE_PROVIDER_SITE_OTHER): Payer: BC Managed Care – PPO | Admitting: Neurology

## 2017-05-25 ENCOUNTER — Encounter: Payer: Self-pay | Admitting: Neurology

## 2017-05-25 VITALS — BP 130/78 | HR 89 | Ht 63.0 in | Wt 195.5 lb

## 2017-05-25 DIAGNOSIS — M545 Low back pain, unspecified: Secondary | ICD-10-CM

## 2017-05-25 DIAGNOSIS — G5713 Meralgia paresthetica, bilateral lower limbs: Secondary | ICD-10-CM | POA: Insufficient documentation

## 2017-05-25 DIAGNOSIS — G8929 Other chronic pain: Secondary | ICD-10-CM | POA: Insufficient documentation

## 2017-05-25 MED ORDER — GABAPENTIN 300 MG PO CAPS: ORAL_CAPSULE | ORAL | 5 refills | Status: DC

## 2017-05-25 NOTE — Progress Notes (Signed)
[facility] Clinic Note - Initial Visit   Date: 05/25/17  Brittany LarsenBarbara Dannemiller MRN: [id number] DOB: February 17, 1961   Dear Dr. Constance GoltzSchoenhoff:  Thank you for your kind referral of Brittany Roberson for consultation of bilateral leg paresthesias. Although her history is well known to you, please allow us to reiterate it for the purpose of our medical record. The patient was accompanied to the clinic by self.   History of Present Illness: Brittany Roberson is a 57 y.o. right-handed Caucasian female presenting for evaluation of bilateral leg paresthesias.    She was diagnosed with meralgia paresthetica of the left thigh around 20 years ago.  Tingling has been intermittent over the years, but since late 2018, she developed constant burning pain over the right thigh.  She also complains of throbbing pain and tenderness over the left thigh.  She denies any weakness of the legs.  Pain is worse with laying down and improved with laying on her side.  Prolonged walking can exacerbate her pain.   Around the same time, she felt tingling over the top of the foot which occurs a few times per week, lasting a few minutes.  She complains of chronic low back pain. She also complains of right hip pain and burning pain over the right thigh. Again, no weakness.  No recent weight gain. She denies wearing tight, constrictive clothing.  She also has right shoulder and neck pain and is being followed by Dr. Dion SaucierLandau for this.   Out-side paper records, electronic medical record, and images have been reviewed where available and summarized as:  Labs 03/02/2017: vitamin B12 637, TSH 1.51  XR lumbar spine 05/30/2013:  No fracture or acute finding.  Mild degenerative changes.  Past Medical History:  Diagnosis Date  . Allergy   . Arthritis    knees  . Endometriosis   . Hyperlipidemia   . Menopause   . Obesity     Past Surgical History:  Procedure Laterality Date  . ABDOMINAL HYSTERECTOMY     2007    . carpal tunnel repair  2/11   Right  . INCONTINENCE SURGERY  12/07  . KNEE ARTHROSCOPY    . KNEE CARTILAGE SURGERY  2014    right kneen   . menicus repair     rt.knee-11/2009  . TOTAL KNEE ARTHROPLASTY Right 07/31/2015   Procedure: RIGHT TOTAL KNEE ARTHROPLASTY;  Surgeon: Durene RomansMatthew Olin, MD;  Location: [facility];  Service: Orthopedics;  Laterality: Right;     Medications:  Outpatient Encounter Medications as of 05/25/2017  Medication Sig Note  . Cholecalciferol (VITAMIN D3) 2000 units TABS Take 2,000 Units by mouth daily. 07/09/2015: Stopped for her procedure.   . Cyanocobalamin (VITAMIN B-12 PO) Take 1 tablet by mouth daily. 07/09/2015: Stopped for her procedure.  . ferrous sulfate 325 (65 FE) MG tablet Take 1 tablet (325 mg total) by mouth 3 (three) times daily after meals.   . meloxicam (MOBIC) 15 MG tablet Take 15 mg by mouth daily.   . [DISCONTINUED] celecoxib (CELEBREX) 200 MG capsule Take 1 capsule (200 mg total) by mouth every 12 (twelve) hours.   . [DISCONTINUED] docusate sodium (COLACE) 100 MG capsule Take 1 capsule (100 mg total) by mouth 2 (two) times daily.   . [DISCONTINUED] HYDROcodone-acetaminophen (NORCO) 7.5-325 MG tablet Take 1-2 tablets by mouth every 4 (four) hours as needed for moderate pain.   . [DISCONTINUED] methocarbamol (ROBAXIN) 500 MG tablet Take 1 tablet (500 mg total) by mouth every 6 (six) hours as  needed for muscle spasms.   . [DISCONTINUED] polyethylene glycol (MIRALAX / GLYCOLAX) packet Take 17 g by mouth 2 (two) times daily.   Marland Kitchen gabapentin (NEURONTIN) 300 MG capsule Take 1 tablet at bedtime for one week, then increase to 1 tablet twice daily    No facility-administered encounter medications on file as of 05/25/2017.      Allergies:  Allergies  Allergen Reactions  . Shellfish Allergy Itching and Other (See Comments)    flushing    Family History: Family History  Problem Relation Age of Onset  . Hypertension Father   . Hyperlipidemia Father   .  Prostate cancer Father   . Hypothyroidism Mother   . Hypertension Mother   . Healthy Sister   . Healthy Brother     Social History: Social History   Tobacco Use  . Smoking status: Never Smoker  . Smokeless tobacco: Never Used  Substance Use Topics  . Alcohol use: No  . 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 She has three adopted children.     Review of Systems:  CONSTITUTIONAL: No fevers, chills, night sweats, or weight loss.   EYES: No visual changes or eye pain ENT: No hearing changes.  No history of nose bleeds.   RESPIRATORY: No cough, wheezing and shortness of breath.   CARDIOVASCULAR: Negative for chest pain, and palpitations.   GI: Negative for abdominal discomfort, blood in stools or black stools.  No recent change in bowel habits.   GU:  No history of incontinence.   MUSCLOSKELETAL: +history of joint pain or swelling.  No myalgias.   SKIN: Negative for lesions, rash, and itching.   HEMATOLOGY/ONCOLOGY: Negative for prolonged bleeding, bruising easily, and swollen nodes.  No history of cancer.   ENDOCRINE: Negative for cold or heat intolerance, polydipsia or goiter.   PSYCH:  No depression or anxiety symptoms.   NEURO: As Above.   Vital Signs:  BP 130/78   Pulse 89   Ht 5\' 3"  (1.6 m)   Wt 195 lb 8 oz (88.7 kg)   SpO2 96%   BMI 34.63 kg/m   General Medical Exam:   General:  Well appearing, comfortable.   Eyes/ENT: see cranial nerve examination.   Neck: No masses appreciated.  Full range of motion without tenderness.  No carotid bruits. Respiratory:  Clear to auscultation, good air entry bilaterally.   Cardiac:  Regular rate and rhythm, no murmur.   Extremities:  No deformities, edema, or skin discoloration.  Skin:  No rashes or lesions.  Neurological Exam: MENTAL STATUS including  orientation to time, place, person, recent and remote memory, attention span and concentration, language, and fund of knowledge is normal.  Speech is not dysarthric.  CRANIAL NERVES: II:  No visual field defects.  Unremarkable fundi.   III-IV-VI: Pupils equal round and reactive to light.  Normal conjugate, extra-ocular eye movements in all directions of gaze.  No nystagmus.  No ptosis.   V:  Normal facial sensation.     VII:  Normal facial symmetry and movements.  No pathologic facial reflexes.  VIII:  Normal hearing and vestibular function.   IX-X:  Normal palatal movement.   XI:  Normal shoulder shrug and head rotation.   XII:  Normal tongue strength and range of motion,  no deviation or fasciculation.  MOTOR:  No atrophy, fasciculations or abnormal movements.  No pronator drift.  Tone is normal.    Right Upper Extremity:    Left Upper Extremity:    Deltoid  5/5   Deltoid  5/5   Biceps  5/5   Biceps  5/5   Triceps  5/5   Triceps  5/5   Wrist extensors  5/5   Wrist extensors  5/5   Wrist flexors  5/5   Wrist flexors  5/5   Finger extensors  5/5   Finger extensors  5/5   Finger flexors  5/5   Finger flexors  5/5   Dorsal interossei  5/5   Dorsal interossei  5/5   Abductor pollicis  5/5   Abductor pollicis  5/5   Tone (Ashworth scale)  0  Tone (Ashworth scale)  0   Right Lower Extremity:    Left Lower Extremity:    Hip flexors  5/5   Hip flexors  5/5   Hip extensors  5/5   Hip extensors  5/5   Knee flexors  5/5   Knee flexors  5/5   Knee extensors  5/5   Knee extensors  5/5   Dorsiflexors  5/5   Dorsiflexors  5/5   Plantarflexors  5/5   Plantarflexors  5/5   Toe extensors  5/5   Toe extensors  5/5   Toe flexors  5/5   Toe flexors  5/5   Tone (Ashworth scale)  0  Tone (Ashworth scale)  0   MSRs:  Right                                                                 Left brachioradialis 2+  brachioradialis 2+  biceps 2+  biceps 2+  triceps 2+  triceps 2+  patellar 2+  patellar  2+  ankle jerk 2+  ankle jerk 2+  Hoffman no  Hoffman no  plantar response down  plantar response down   SENSORY:  Reduced temperature and pin prick over the left lateral femoral cutaneous nerve distribution, otherwise, normal and symmetric perception of light touch, vibration, and proprioception.  Romberg's sign absent.   COORDINATION/GAIT: Normal finger-to- nose-finger and heel-to-shin.  Intact rapid alternating movements bilaterally. Gait narrow based and stable. Tandem and stressed gait intact.    IMPRESSION: Mrs. Loth is a 57 year-old female referred for evaluation of right > left thigh paresthesias.  Her examination is consistent with diminished sensation over the anterolateral thigh on the left side. 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 Management is conservative.  Although she has chronic low back pain, she denies radicular pain and does not have hip flexion weakness, making lumbar radiculopathy less likely.  She most likely has lumbar strain.   Her left foot paresthesias is very sporadic and nonspecific.  With normal exam, double this is anything worrisome.  If symptoms become more frequent, recommend NCS/EMG of the left leg.     PLAN/RECOMMENDATIONS:  1. Start neurontin 300mg  at bedtime x1 week, then increase to 300mg  twice daily 2. She may also try  OTC lidocaine ointment 3. Start physical therapy for low back pain 4. Weight loss encouraged 5. If she develops radicular pain or weakness, recommend MRI lumbar spine   Patient to send MyChart update in 2 months  Return to clinic in 6 months, or sooner as needed   Thank you for allowing me to participate in patient's care.  If I can answer any additional questions, I would be pleased to do so.    Sincerely,    Donika K. Allena Katz, DO

## 2017-05-25 NOTE — Patient Instructions (Addendum)
Start gabapentin 300mg  at bedtime for one week, then increase to 300mg  twice daily  You can also try over the counter lidocaine ointment (Aspercream, Salonpas)  Start physical therapy for low back strengthening  Weight loss encouraged  Send my MyChart in 2 months  Return to clinic in 6 months

## 2017-05-30 NOTE — Procedures (Signed)
   NAME: Brittany LarsenBarbara Roberson DATE OF BIRTH:  15-Oct-1960 MEDICAL RECORD NUMBER [id number]  LOCATION: [facility]  PHYSICIAN: Deretha EmoryJames C Osborne  DATE OF STUDY: 05/23/2017  SLEEP STUDY TYPE: Nocturnal Polysomnogram               REFERRING PHYSICIAN: Deretha Emorysborne, James C, MD  INDICATION FOR STUDY: snoring and witnessed apnea  EPWORTH SLEEPINESS SCORE:  Not available HEIGHT: 5\' 5"  (165.1 cm)  WEIGHT: 195 lb (88.5 kg)    Body mass index is 32.45 kg/m.  NECK SIZE: 15 in.  MEDICATIONS  Patient self administered medications include: N/A. Medications administered during study include No sleep medicine administered.Marland Kitchen.   SLEEP STUDY TECHNIQUE  A multi-channel overnight Polysomnography study was performed. The channels recorded and monitored were central and occipital EEG, electrooculogram (EOG), submentalis EMG (chin), nasal and oral airflow, thoracic and abdominal wall motion, anterior tibialis EMG, snore microphone, electrocardiogram, and a pulse oximetry.   TECHNICAL COMMENTS  Comments added by Technician: NONE Comments added by Scorer: N/A   SLEEP ARCHITECTURE  The study was initiated at 9:29:47 PM and terminated at 4:30:22 AM. The total recorded time was 420.6 minutes. EEG confirmed total sleep time was 342.3 minutes yielding a sleep efficiency of 81.4%%. Sleep onset after lights out was 33.3 minutes with a REM latency of 94.5 minutes. The patient spent 2.2%% of the night in stage N1 sleep, 85.4%% in stage N2 sleep, 0.0%% in stage N3 and 12.42% in REM. Wake after sleep onset (WASO) was 45.0 minutes. The Arousal Index was 13.0/hour.   RESPIRATORY PARAMETERS  There were a total of 13 respiratory disturbances out of which 0 were apneas ( 0 obstructive, 0 mixed, 0 central) and 13 hypopneas. The apnea/hypopnea index (AHI) was 2.3 events/hour. The central sleep apnea index was 0.0 events/hour. The REM AHI was 9.9 events/hour and NREM AHI was 1.2 events/hour. The overall RDI was 6.3/hr  and REM RDI was 27/hr. The supine AHI was 3.2 events/hour and the non supine AHI was 0.00. Respiratory disturbances were associated with oxygen desaturation down to a nadir of 90.0% during sleep. The mean oxygen saturation during the study was 94.2%. The cumulative time under 88% oxygen saturation was 5.5 minutes.  LEG MOVEMENT DATA  The total leg movements were 350 with a resulting leg movement index of 61.4/hr . Associated arousal with leg movement index was 6.5/hr.   CARDIAC DATA  The underlying cardiac rhythm was most consistent with sinus rhythm. Mean heart rate during sleep was 62.8 bpm. Additional rhythm abnormalities include None.   IMPRESSIONS  - No Significant Obstructive Sleep apnea (OSA) by AHI. Mild sleep apnea by RDI.  - Leg movements(PLMs) were seen during sleep. Associated arousals were borderline with an arousal index of 6.5 /hour.  DIAGNOSIS  - No significant sleep apnea.   RECOMMENDATIONS  - No intervention for sleep disordered breathing is recommended.   Deretha EmoryJames C Osborne Sleep specialist, American Board of Internal Medicine  ELECTRONICALLY SIGNED ON:  05/30/2017, 9:24 PM [facility] PH: [phone]   FX: [phone] ACCREDITED BY THE AMERICAN ACADEMY OF SLEEP MEDICINE

## 2017-08-01 ENCOUNTER — Encounter: Payer: Self-pay | Admitting: Emergency Medicine

## 2017-08-01 ENCOUNTER — Emergency Department
Admission: EM | Admit: 2017-08-01 | Discharge: 2017-08-01 | Disposition: A | Discharge status: Home / Self Care | Payer: BC Managed Care – PPO | Attending: Emergency Medicine | Admitting: Emergency Medicine

## 2017-08-01 ENCOUNTER — Emergency Department: Payer: BC Managed Care – PPO

## 2017-08-01 ENCOUNTER — Other Ambulatory Visit: Payer: Self-pay

## 2017-08-01 DIAGNOSIS — Y929 Unspecified place or not applicable: Secondary | ICD-10-CM | POA: Insufficient documentation

## 2017-08-01 DIAGNOSIS — S52121A Displaced fracture of head of right radius, initial encounter for closed fracture: Secondary | ICD-10-CM | POA: Diagnosis not present

## 2017-08-01 DIAGNOSIS — W01198A Fall on same level from slipping, tripping and stumbling with subsequent striking against other object, initial encounter: Secondary | ICD-10-CM | POA: Insufficient documentation

## 2017-08-01 DIAGNOSIS — Z96651 Presence of right artificial knee joint: Secondary | ICD-10-CM | POA: Diagnosis not present

## 2017-08-01 DIAGNOSIS — Y999 Unspecified external cause status: Secondary | ICD-10-CM | POA: Insufficient documentation

## 2017-08-01 DIAGNOSIS — Y9301 Activity, walking, marching and hiking: Secondary | ICD-10-CM | POA: Insufficient documentation

## 2017-08-01 DIAGNOSIS — S59911A Unspecified injury of right forearm, initial encounter: Secondary | ICD-10-CM | POA: Diagnosis present

## 2017-08-01 DIAGNOSIS — I10 Essential (primary) hypertension: Secondary | ICD-10-CM | POA: Diagnosis not present

## 2017-08-01 MED ORDER — NAPROXEN 500 MG PO TABS: 500.0000 mg | ORAL_TABLET | Freq: Two times a day (BID) | ORAL | Status: AC

## 2017-08-01 MED ORDER — HYDROMORPHONE HCL 1 MG/ML IJ SOLN
1.0000 mg | Freq: Once | INTRAMUSCULAR | Status: AC
  Administered 2017-08-01: 1 mg via INTRAMUSCULAR
  Filled 2017-08-01: qty 1

## 2017-08-01 MED ORDER — TRAMADOL HCL 50 MG PO TABS: 50.0000 mg | ORAL_TABLET | Freq: Two times a day (BID) | ORAL | 0 refills | Status: AC | PRN

## 2017-08-01 NOTE — Discharge Instructions (Signed)
Wear splint and sling until evaluation by orthopedics. °

## 2017-08-01 NOTE — ED Triage Notes (Signed)
Pt reports fall with right arm injury, pt reports stepping off a 5" rise and tripped, landing on the right arm.  Pt denies arresting fall with arm.  No apparent injury, reflects pain with palpation to the right wrist, elbow, and shoulder.  Pt denies LOC.  Pt CMS intact to right fingers.  Oriented and alert x 4

## 2017-08-01 NOTE — ED Provider Notes (Signed)
[facility] Emergency Department Provider Note   ____________________________________________   First MD Initiated Contact with Patient 08/01/17 1655     (approximate)  I have reviewed the triage vital signs and the nursing notes.   HISTORY  Chief Complaint Fall and Arm Injury    HPI Brittany Roberson is a 57 y.o. female patient complaining of right knee injury secondary to tripping over 5 inch raised area.  Patient states she landed on her right arm.  Patient states pain is at the right shoulder, elbow, and wrist.  Patient denies loss of sensation.  Patient state pain increased with abduction of the right upper extremity.  Patient also complained of pain with flexion of the elbow and wrist.  Patient rates the pain a 7/10.  Patient described pain is "aching".  No palliative measures prior to arrival.  Past Medical History:  Diagnosis Date  . Allergy   . Arthritis    knees  . Endometriosis   . Hyperlipidemia   . Menopause   . Obesity     Patient Active Problem List   Diagnosis Date Noted  . Meralgia paresthetica, bilateral lower limbs 05/25/2017  . Chronic midline low back pain without sciatica 05/25/2017  . Obese 08/01/2015  . S/P right TKA 07/31/2015  . HTN (hypertension) 01/11/2014  . Herpes zoster 01/11/2014  . Endometriosis 05/30/2013  . DJD (degenerative joint disease) of knee 04/14/2012  . Other and unspecified hyperlipidemia 04/14/2012  . S/P hysterectomy 04/14/2012  . History of hysterectomy, supracervical 04/14/2012  . Psoriasis 04/14/2012  . SK (seborrheic keratosis) 04/14/2012    Past Surgical History:  Procedure Laterality Date  . ABDOMINAL HYSTERECTOMY     2007  . carpal tunnel repair  2/11   Right  . INCONTINENCE SURGERY  12/07  . KNEE ARTHROSCOPY    . KNEE CARTILAGE SURGERY  2014    right kneen   . menicus repair     rt.knee-11/2009  . TOTAL KNEE ARTHROPLASTY Right 07/31/2015   Procedure: RIGHT TOTAL KNEE  ARTHROPLASTY;  Surgeon: Durene Romans, MD;  Location: [facility];  Service: Orthopedics;  Laterality: Right;    Prior to Admission medications   Medication Sig Start Date End Date Taking? Authorizing Provider  Cholecalciferol (VITAMIN D3) 2000 units TABS Take 2,000 Units by mouth daily.    [provider]  Cyanocobalamin (VITAMIN B-12 PO) Take 1 tablet by mouth daily.    [provider]  ferrous sulfate 325 (65 FE) MG tablet Take 1 tablet (325 mg total) by mouth 3 (three) times daily after meals. 08/01/15   Lanney Gins, PA-C  gabapentin (NEURONTIN) 300 MG capsule Take 1 tablet at bedtime for one week, then increase to 1 tablet twice daily 05/25/17   Nita Sickle K, DO  meloxicam (MOBIC) 15 MG tablet Take 15 mg by mouth daily.    [provider]  naproxen (NAPROSYN) 500 MG tablet Take 1 tablet (500 mg total) by mouth 2 (two) times daily with a meal. 08/01/17   Joni Reining, PA-C  traMADol (ULTRAM) 50 MG tablet Take 1 tablet (50 mg total) by mouth every 12 (twelve) hours as needed. 08/01/17   Joni Reining, PA-C    Allergies Shellfish allergy  Family History  Problem Relation Age of Onset  . Hypertension Father   . Hyperlipidemia Father   . Prostate cancer Father   . Hypothyroidism Mother   . Hypertension Mother   . Healthy Sister   . Healthy Brother  Social History Social History   Tobacco Use  . Smoking status: Never Smoker  . Smokeless tobacco: Never Used  Substance Use Topics  . Alcohol use: No  . Drug use: No    Review of Systems Constitutional: No fever/chills Eyes: No visual changes. ENT: No sore throat. Cardiovascular: Denies chest pain. Respiratory: Denies shortness of breath. Gastrointestinal: No abdominal pain.  No nausea, no vomiting.  No diarrhea.  No constipation. Genitourinary: Negative for dysuria. Musculoskeletal: Right upper extremity pain. Skin: Negative for rash. Neurological: Negative for headaches, focal weakness or  numbness. Endocrine:Hyperlipidemia. Allergic/Immunilogical: Shellfish. ____________________________________________   PHYSICAL EXAM:  VITAL SIGNS: ED Triage Vitals  Enc Vitals Group     BP 08/01/17 1638 129/66     Pulse Rate 08/01/17 1638 70     Resp 08/01/17 1638 18     Temp 08/01/17 1638 98.1 F (36.7 C)     Temp Source 08/01/17 1638 Oral     SpO2 08/01/17 1638 97 %     Weight 08/01/17 1639 195 lb (88.5 kg)     Height 08/01/17 1639 5\' 3"  (1.6 m)     Head Circumference --      Peak Flow --      Pain Score 08/01/17 1639 7     Pain Loc --      Pain Edu? --      Excl. in GC? --    Constitutional: Alert and oriented. Well appearing and in no acute distress. Neck: No stridor.   Cardiovascular: Normal rate, regular rhythm. Grossly normal heart sounds.  Good peripheral circulation. Respiratory: Normal respiratory effort.  No retractions. Lungs CTAB. Musculoskeletal: No obvious deformity to the right upper extremity.  Patient has moderate guarding palpation of the lateral humerus, lateral elbow, and right wrist.  Patient has decreased range of motion with abduction overhead reaching limited by complaint of pain. Neurologic:  Normal speech and language. No gross focal neurologic deficits are appreciated. No gait instability. Skin:  Skin is warm, dry and intact. No rash noted. Psychiatric: Mood and affect are normal. Speech and behavior are normal.  ____________________________________________   LABS (all labs ordered are listed, but only abnormal results are displayed)  Labs Reviewed - No data to display ____________________________________________  EKG   ____________________________________________  RADIOLOGY    Official radiology report(s): Dg Shoulder Right  Result Date: 08/01/2017 CLINICAL DATA:  Fall.  Proximal radius fracture EXAM: RIGHT SHOULDER - 2+ VIEW COMPARISON:  Elbow radiograph same day FINDINGS: Glenohumeral joint is intact. No evidence of scapular  fracture or humeral fracture. The acromioclavicular joint is intact. IMPRESSION: No fracture or dislocation. Electronically Signed   By: Genevive BiStewart  Edmunds M.D.   On: 08/01/2017 18:04   Dg Elbow Complete Right  Result Date: 08/01/2017 CLINICAL DATA:  Pt reports fall with right arm injury, pt reports stepping off a 5" rise and tripped, landing on the right arm. Pt denies arresting fall with arm. Happened today. Most of her pain is posterior right elbow. EXAM: RIGHT ELBOW - COMPLETE 3+ VIEW COMPARISON:  None. FINDINGS: Impaction fracture of the proximal radius at the neck of the radial head. Minimal angulation. No dislocation. On lateral projection there is a loose body projecting over the joint anteriorly. No clear donor site. IMPRESSION: Impaction fracture through the neck of the radial head. Potential avulsion fragment projecting over the joint anteriorly on the lateral projection. No clear donor site. Electronically Signed   By: Genevive BiStewart  Edmunds M.D.   On: 08/01/2017 18:03   Dg  Wrist Complete Right  Result Date: 08/01/2017 CLINICAL DATA:  Pt reports fall with right arm injury, pt reports stepping off a 5" rise and tripped, landing on the right arm. Pt denies arresting fall with arm. Happened today. EXAM: RIGHT WRIST - COMPLETE 3+ VIEW COMPARISON:  None. FINDINGS: No distal radius or ulnar fracture. Radiocarpal joint is intact. No carpal fracture. No soft tissue abnormality. IMPRESSION: No fracture or dislocation. Electronically Signed   By: Genevive Bi M.D.   On: 08/01/2017 17:59    ____________________________________________   PROCEDURES  Procedure(s) performed: None  Procedures  Critical Care performed: No  ____________________________________________   INITIAL IMPRESSION / ASSESSMENT AND PLAN / ED COURSE  As part of my medical decision making, I reviewed the following data within the electronic MEDICAL RECORD NUMBER    Right upper extremity pain secondary to impact fracture through  the neck of the radial head.  Discussed negative x-ray findings of the shoulder and wrist.  Patient placed in a posterior splint and arm sling.  Patient advised to follow orthopedic by calling for an appointment on Monday morning.  Patient given prescription naproxen and tramadol.      ____________________________________________   FINAL CLINICAL IMPRESSION(S) / ED DIAGNOSES  Final diagnoses:  Closed displaced fracture of head of right radius, initial encounter     ED Discharge Orders        Ordered    naproxen (NAPROSYN) 500 MG tablet  2 times daily with meals     08/01/17 1813    traMADol (ULTRAM) 50 MG tablet  Every 12 hours PRN     08/01/17 1813       Note:  This document was prepared using Dragon voice recognition software and may include unintentional dictation errors.    Joni Reining, PA-C 08/01/17 1817    Arnaldo Natal, MD 08/01/17 2052

## 2017-08-05 ENCOUNTER — Ambulatory Visit
Admission: RE | Admit: 2017-08-05 | Discharge: 2017-08-05 | Disposition: A | Discharge status: Home / Self Care | Payer: BC Managed Care – PPO | Source: Ambulatory Visit | Attending: Orthopedic Surgery | Admitting: Orthopedic Surgery

## 2017-08-05 ENCOUNTER — Other Ambulatory Visit: Payer: Self-pay | Admitting: Orthopedic Surgery

## 2017-08-05 DIAGNOSIS — M25521 Pain in right elbow: Secondary | ICD-10-CM

## 2017-11-27 ENCOUNTER — Ambulatory Visit: Payer: BC Managed Care – PPO | Admitting: Neurology

## 2018-04-28 ENCOUNTER — Other Ambulatory Visit (HOSPITAL_COMMUNITY)
Admission: RE | Admit: 2018-04-28 | Discharge: 2018-04-28 | Disposition: A | Discharge status: Home / Self Care | Payer: BC Managed Care – PPO | Source: Ambulatory Visit | Attending: Internal Medicine | Admitting: Internal Medicine

## 2018-04-28 ENCOUNTER — Other Ambulatory Visit: Payer: Self-pay | Admitting: Internal Medicine

## 2018-04-28 DIAGNOSIS — Z1231 Encounter for screening mammogram for malignant neoplasm of breast: Secondary | ICD-10-CM

## 2018-04-28 DIAGNOSIS — Z124 Encounter for screening for malignant neoplasm of cervix: Secondary | ICD-10-CM | POA: Insufficient documentation

## 2018-05-04 LAB — CYTOLOGY - PAP
Adequacy: ABSENT
Diagnosis: NEGATIVE
HPV: NOT DETECTED

## 2018-05-31 ENCOUNTER — Ambulatory Visit: Payer: BC Managed Care – PPO

## 2018-08-05 ENCOUNTER — Ambulatory Visit: Payer: BC Managed Care – PPO

## 2018-09-20 ENCOUNTER — Ambulatory Visit: Payer: BC Managed Care – PPO

## 2018-11-02 ENCOUNTER — Other Ambulatory Visit: Payer: Self-pay

## 2018-11-02 ENCOUNTER — Other Ambulatory Visit: Payer: Self-pay | Admitting: Internal Medicine

## 2018-11-02 ENCOUNTER — Ambulatory Visit
Admission: RE | Admit: 2018-11-02 | Discharge: 2018-11-02 | Disposition: A | Discharge status: Home / Self Care | Payer: BC Managed Care – PPO | Source: Ambulatory Visit | Attending: Internal Medicine | Admitting: Internal Medicine

## 2018-11-02 DIAGNOSIS — Z1231 Encounter for screening mammogram for malignant neoplasm of breast: Secondary | ICD-10-CM

## 2019-09-07 ENCOUNTER — Other Ambulatory Visit: Payer: Self-pay

## 2019-09-07 ENCOUNTER — Encounter: Payer: BC Managed Care – PPO | Attending: Internal Medicine | Admitting: Dietician

## 2019-09-07 ENCOUNTER — Encounter: Payer: Self-pay | Admitting: Dietician

## 2019-09-07 VITALS — Ht 63.0 in | Wt 201.8 lb

## 2019-09-07 DIAGNOSIS — E669 Obesity, unspecified: Secondary | ICD-10-CM

## 2019-09-07 DIAGNOSIS — Z6835 Body mass index (BMI) 35.0-35.9, adult: Secondary | ICD-10-CM

## 2019-09-07 NOTE — Progress Notes (Signed)
Medical Nutrition Therapy: Visit start time: 1100  end time: 1200  Assessment:  Diagnosis: obesity Past medical history: hyperlipidemia Psychosocial issues/ stress concerns: none  Preferred learning method:  . Auditory   Current weight: 201.8lbs with shoes  Height: 5'3" Medications, supplements: reconciled list in medical record  Progress and evaluation:   Patient reports weight gain over the past year with increased stress. She has retired as of 2 weeks ago and plans to focus on improving her health status.   She has done weight watchers, other diet plans (56 day program) with only short-term success.   Husband has HTN and hyperlipidemia so they are working on some changes together.   Physical activity: walking 30 minutes, 2 times a week; was going to gym and swimming prior to COVID  Dietary Intake:  Usual eating pattern includes 3 meals and 0-1 snacks per day. Dining out frequency: 1 meals per week.  Breakfast: steel cut oats; honey bunches of oats with fruit Snack: none Lunch: leftover pasta, pizza, etc. Snack: was snacking after work; cheese sticks; crackers, usu salty snacks Supper: pasta, chicken; steak/ some beef; salad;  Snack: none Beverages: water, bai water, 1/2-sweet tea;  occasional diet coke 1-2x a month  Nutrition Care Education: Topics covered:  Basic nutrition: basic food groups, appropriate nutrient balance, appropriate meal and snack schedule, general nutrition guidelines    Weight control: identifying healthy weight, importance of low sugar and low fat choices, portion control strategies including increasing low-carb veg while controlling starch portions, estimated energy needs for weight loss at 1200-1300 kcal, provided guidance for 45% CHO, 25% protein,and 30% fat; discussed role of physical activity  Nutritional Diagnosis:  Busby-3.3 Overweight/obesity As related to history of excess calories, and inadequate physical activity.  As evidenced by patient with  current BMI of 35.75, working on lifestyle changes to promote weight loss.  Intervention:   Instruction and discussion as noted above.  Patient has made appropriate changes to reduce caloric intake.   Established additional goals for change with direction from patient.  Education Materials given:  . Plate Planner with food lists, sample meal pattern . Sample menus . Goals/ instructions   Learner/ who was taught:  . Patient    Level of understanding: Marland Kitchen Verbalizes/ demonstrates competency   Demonstrated degree of understanding via:   Teach back Learning barriers: . None   Willingness to learn/ readiness for change: . Eager, change in progress   Monitoring and Evaluation:  Dietary intake, exercise, and body weight      follow up: 10/05/19 at 11:00am

## 2019-09-07 NOTE — Patient Instructions (Signed)
   Control snack choices by snacking less often, reducing portions, or making healthier choices such as fruit, small portions of nuts, yogurt.  Plan to eat something every 3-5 hours during the day; wait at least 3 hours after a meal before eating a snack.   OK to have a smoothie for a snack as long as fruit portion is controlled (add low-carb veg to increase volume). Add protein to count as a meal.   Increase frequency and/ or portions of vegetables to help with fulness and keep calories low in meals.

## 2019-10-05 ENCOUNTER — Ambulatory Visit: Payer: BC Managed Care – PPO | Admitting: Dietician

## 2019-11-01 ENCOUNTER — Other Ambulatory Visit: Payer: Self-pay | Admitting: Internal Medicine

## 2019-11-01 DIAGNOSIS — Z1231 Encounter for screening mammogram for malignant neoplasm of breast: Secondary | ICD-10-CM

## 2019-11-04 ENCOUNTER — Encounter: Payer: Self-pay | Admitting: Dietician

## 2019-11-04 NOTE — Progress Notes (Signed)
Have not heard back from patient to reschedule her cancelled appointment from 10/05/19. Sent notification to referring provider.

## 2019-11-14 ENCOUNTER — Other Ambulatory Visit: Payer: Self-pay

## 2019-11-14 ENCOUNTER — Ambulatory Visit
Admission: RE | Admit: 2019-11-14 | Discharge: 2019-11-14 | Disposition: A | Discharge status: Home / Self Care | Payer: BC Managed Care – PPO | Source: Ambulatory Visit | Attending: Internal Medicine | Admitting: Internal Medicine

## 2019-11-14 DIAGNOSIS — Z1231 Encounter for screening mammogram for malignant neoplasm of breast: Secondary | ICD-10-CM

## 2020-08-23 ENCOUNTER — Encounter: Payer: Self-pay | Admitting: Gastroenterology

## 2022-03-28 IMAGING — MG DIGITAL SCREENING BILAT W/ TOMO W/ CAD
8 series · 8 of 24 positions shown · non-contrast
Comparison: Previous exam(s).

CLINICAL DATA: Screening.

EXAM:
DIGITAL SCREENING BILATERAL MAMMOGRAM WITH TOMO AND CAD

[L CC synth-2D]
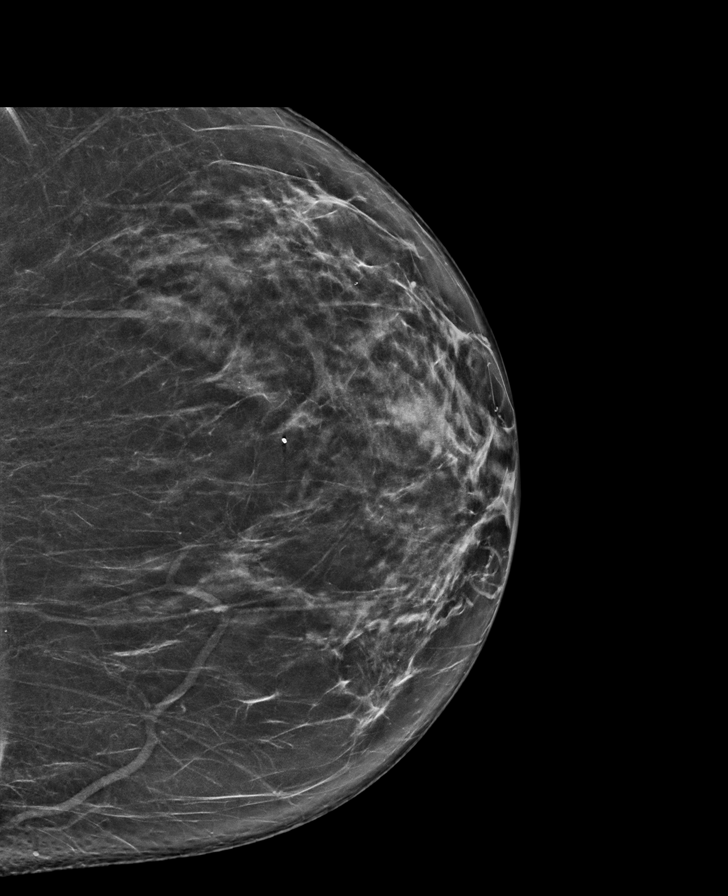

[L MLO synth-2D]
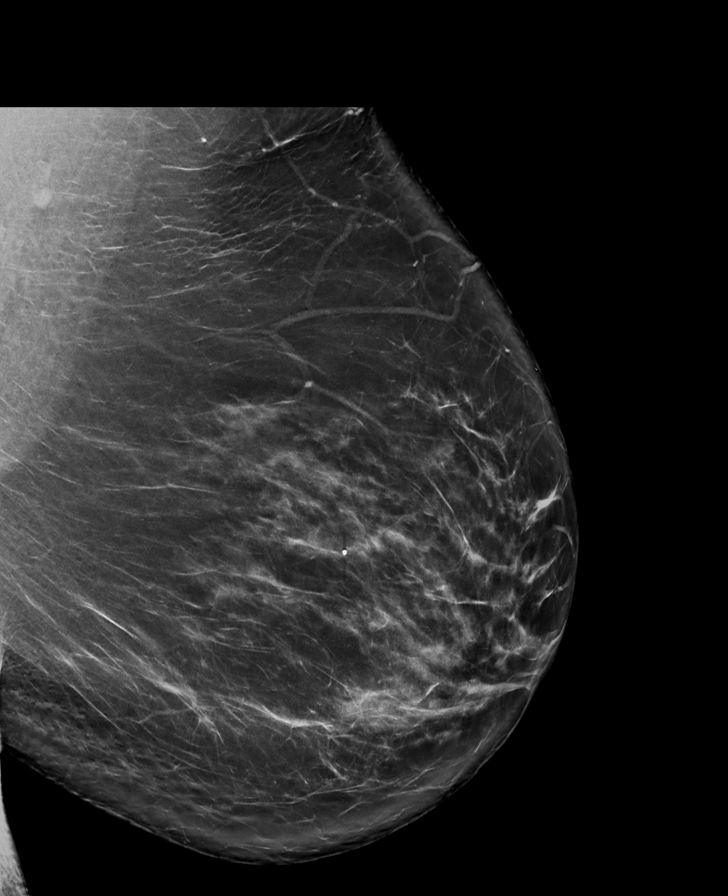

[R CC synth-2D]
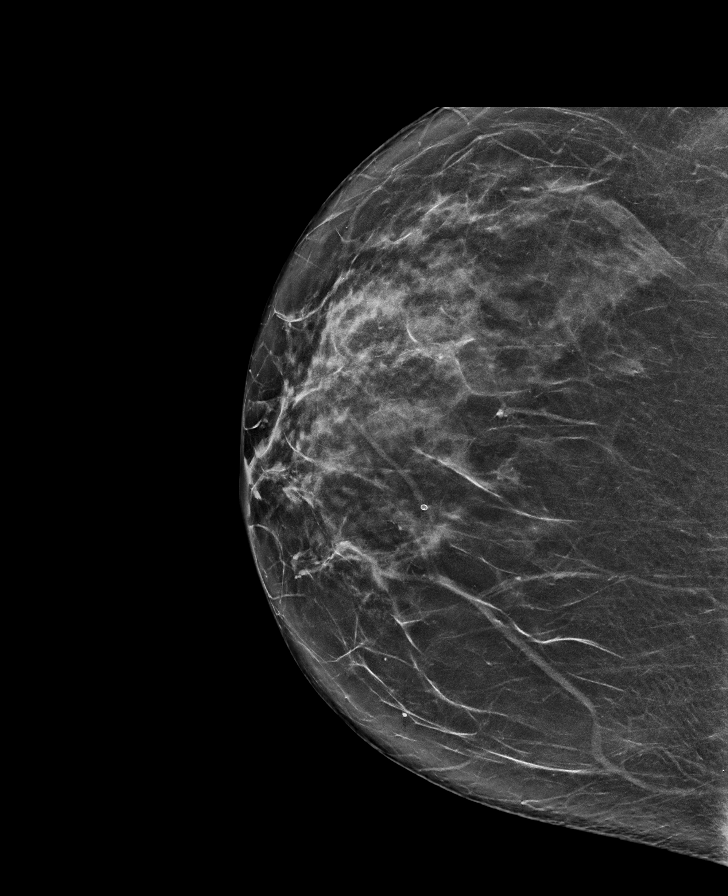

[R MLO synth-2D]
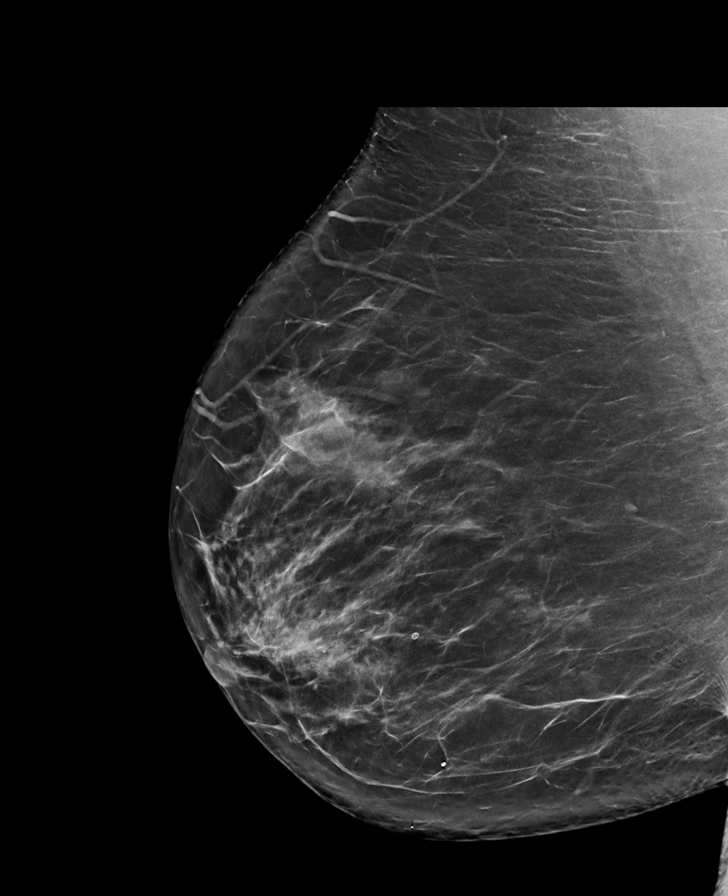

[R CC tomo · tomo slice 39/78.0]
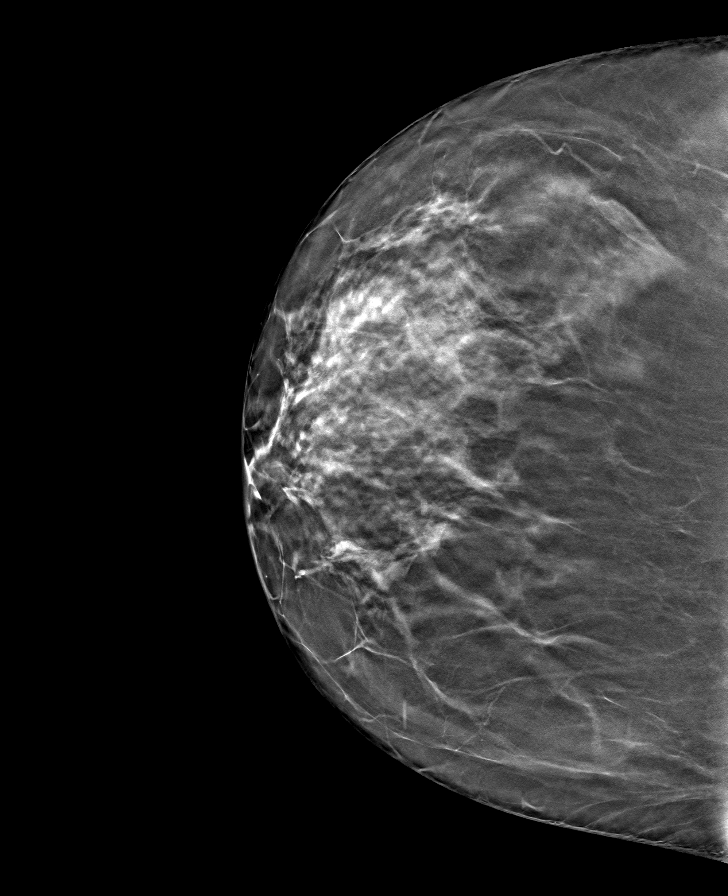

[L MLO tomo · tomo slice 48/95.0]
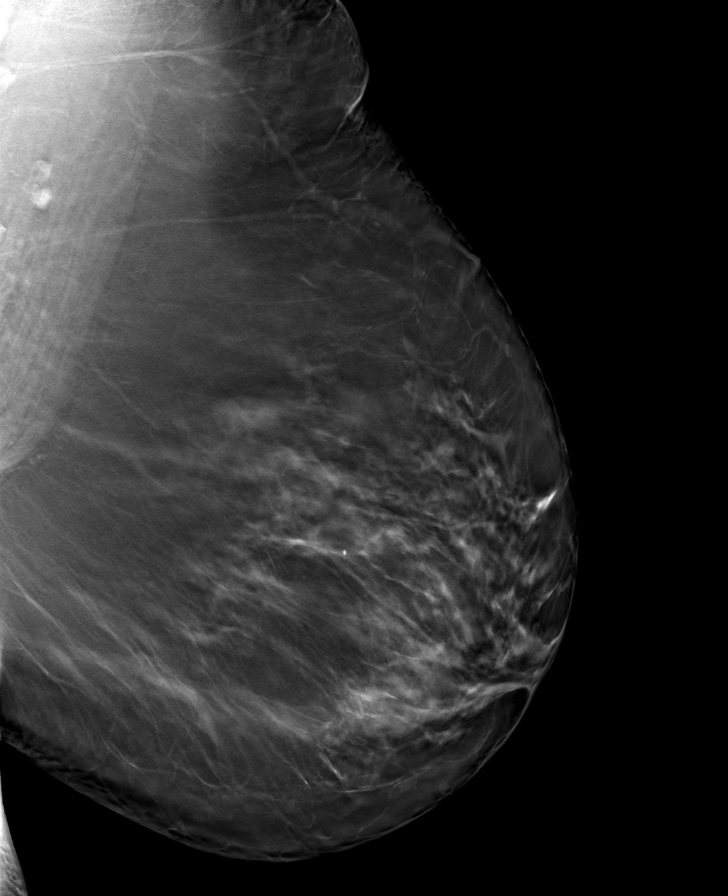

[R MLO tomo · tomo slice 46/91.0]
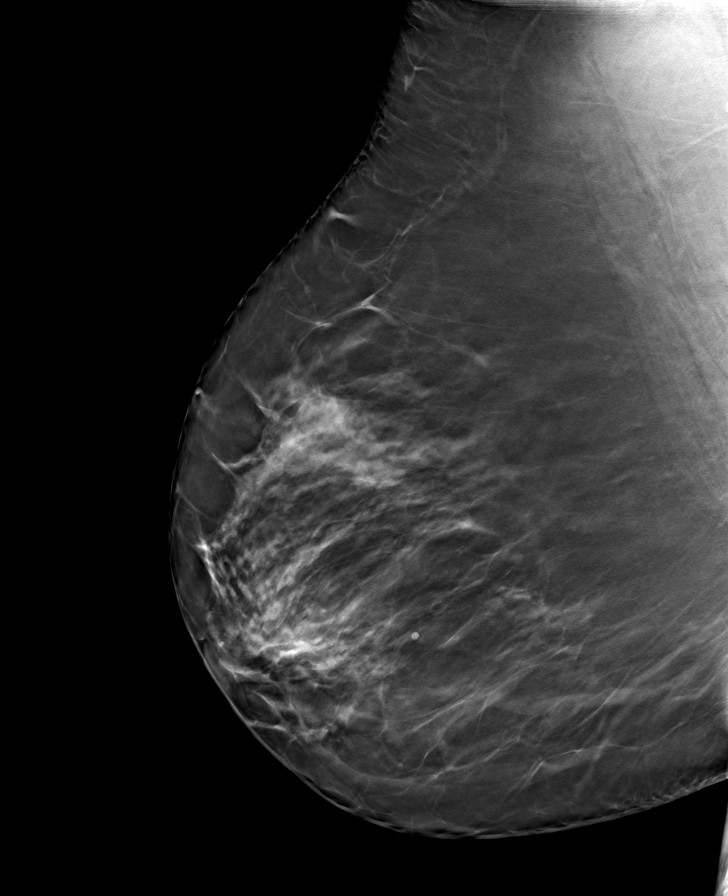

[L CC tomo · tomo slice 39/78.0]
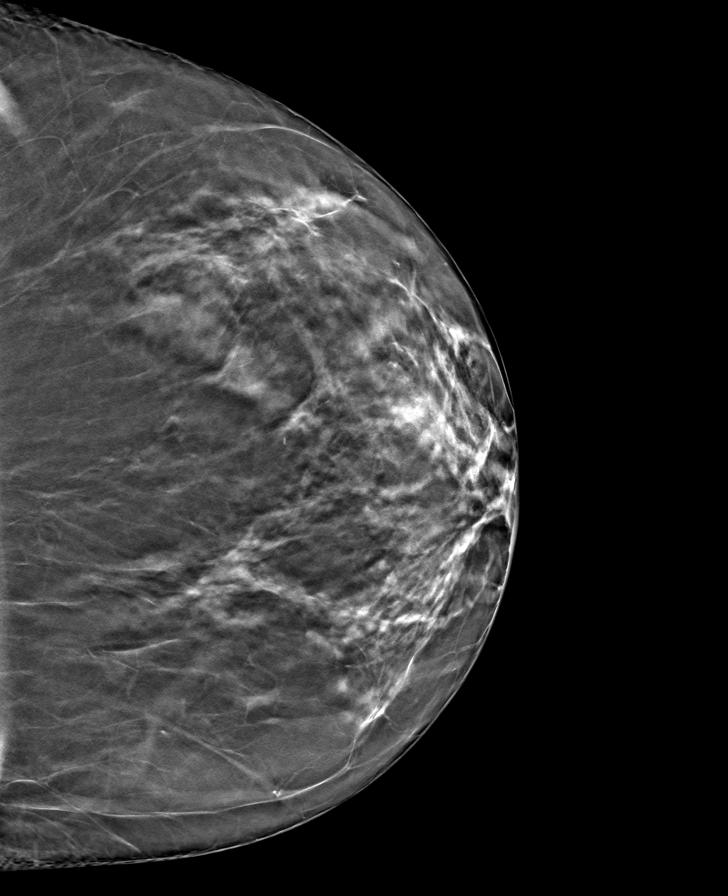

[8 of 24 positions shown; findings below may reference images not displayed]

ACR Breast Density Category c: The breast tissue is heterogeneously
dense, which may obscure small masses.
FINDINGS: There are no findings suspicious for malignancy. Images were
processed with CAD.
IMPRESSION: No mammographic evidence of malignancy. A result letter of this
screening mammogram will be mailed directly to the patient.

RECOMMENDATION:
Screening mammogram in one year. (Code:[id number])

BI-RADS CATEGORY  1: Negative.
